# Patient Record
Sex: Male | Born: 1935 | Race: Black or African American | Hispanic: No | Smoking: Former smoker
Health system: Southern US, Community
[De-identification: ages and names within clinical notes are randomized; demographics above are authoritative.]

## PROBLEM LIST (undated history)

## (undated) DIAGNOSIS — I10 Essential (primary) hypertension: Secondary | ICD-10-CM

---

## 2021-01-06 ENCOUNTER — Other Ambulatory Visit: Payer: Self-pay

## 2021-01-06 DIAGNOSIS — R531 Weakness: Secondary | ICD-10-CM | POA: Diagnosis present

## 2021-01-06 DIAGNOSIS — R5381 Other malaise: Secondary | ICD-10-CM | POA: Diagnosis present

## 2021-01-06 DIAGNOSIS — Z20822 Contact with and (suspected) exposure to covid-19: Secondary | ICD-10-CM | POA: Diagnosis present

## 2021-01-06 DIAGNOSIS — R9431 Abnormal electrocardiogram [ECG] [EKG]: Secondary | ICD-10-CM | POA: Diagnosis not present

## 2021-01-06 DIAGNOSIS — M7989 Other specified soft tissue disorders: Secondary | ICD-10-CM | POA: Diagnosis present

## 2021-01-06 DIAGNOSIS — G4700 Insomnia, unspecified: Secondary | ICD-10-CM | POA: Diagnosis present

## 2021-01-06 DIAGNOSIS — R262 Difficulty in walking, not elsewhere classified: Secondary | ICD-10-CM | POA: Diagnosis present

## 2021-01-06 DIAGNOSIS — C7951 Secondary malignant neoplasm of bone: Secondary | ICD-10-CM | POA: Diagnosis present

## 2021-01-06 DIAGNOSIS — E8809 Other disorders of plasma-protein metabolism, not elsewhere classified: Secondary | ICD-10-CM | POA: Diagnosis present

## 2021-01-06 DIAGNOSIS — Z515 Encounter for palliative care: Secondary | ICD-10-CM

## 2021-01-06 DIAGNOSIS — F0390 Unspecified dementia without behavioral disturbance: Secondary | ICD-10-CM | POA: Diagnosis present

## 2021-01-06 DIAGNOSIS — M79642 Pain in left hand: Secondary | ICD-10-CM | POA: Diagnosis present

## 2021-01-06 DIAGNOSIS — R748 Abnormal levels of other serum enzymes: Secondary | ICD-10-CM | POA: Diagnosis present

## 2021-01-06 DIAGNOSIS — E876 Hypokalemia: Secondary | ICD-10-CM | POA: Diagnosis present

## 2021-01-06 DIAGNOSIS — I10 Essential (primary) hypertension: Secondary | ICD-10-CM | POA: Diagnosis present

## 2021-01-06 DIAGNOSIS — Z681 Body mass index (BMI) 19 or less, adult: Secondary | ICD-10-CM

## 2021-01-06 DIAGNOSIS — R001 Bradycardia, unspecified: Secondary | ICD-10-CM | POA: Diagnosis present

## 2021-01-06 DIAGNOSIS — Z781 Physical restraint status: Secondary | ICD-10-CM

## 2021-01-06 DIAGNOSIS — R627 Adult failure to thrive: Principal | ICD-10-CM | POA: Diagnosis present

## 2021-01-06 DIAGNOSIS — Z79899 Other long term (current) drug therapy: Secondary | ICD-10-CM

## 2021-01-06 DIAGNOSIS — D630 Anemia in neoplastic disease: Secondary | ICD-10-CM | POA: Diagnosis present

## 2021-01-06 DIAGNOSIS — G9341 Metabolic encephalopathy: Secondary | ICD-10-CM | POA: Diagnosis present

## 2021-01-06 DIAGNOSIS — Z87891 Personal history of nicotine dependence: Secondary | ICD-10-CM

## 2021-01-06 DIAGNOSIS — C61 Malignant neoplasm of prostate: Secondary | ICD-10-CM | POA: Diagnosis present

## 2021-01-06 DIAGNOSIS — E785 Hyperlipidemia, unspecified: Secondary | ICD-10-CM | POA: Diagnosis present

## 2021-01-06 NOTE — ED Triage Notes (Incomplete)
Patient arrived via POV c/o weakness with potential fall. Per patient he lowered himself to the floor when his legs got weak. Per family he was found lying on floor. Patient family states recent weight loss. Patient is AO x 2, VS

## 2021-01-06 NOTE — ED Triage Notes (Signed)
Patient arrived via POV c/o weakness with potential fall. Per patient he lowered himself to the floor when his legs got weak. Per family he was found lying on floor. Patient family states recent weight loss. Patient is AO x 2, VS w/ decreased HR, unable to walk at this time.

## 2021-01-07 ENCOUNTER — Other Ambulatory Visit: Payer: Self-pay

## 2021-01-07 ENCOUNTER — Encounter (HOSPITAL_BASED_OUTPATIENT_CLINIC_OR_DEPARTMENT_OTHER): Payer: Self-pay | Admitting: Emergency Medicine

## 2021-01-07 ENCOUNTER — Emergency Department (HOSPITAL_BASED_OUTPATIENT_CLINIC_OR_DEPARTMENT_OTHER): Payer: Medicare Other

## 2021-01-07 ENCOUNTER — Inpatient Hospital Stay (HOSPITAL_BASED_OUTPATIENT_CLINIC_OR_DEPARTMENT_OTHER)
Admission: EM | Admit: 2021-01-07 | Discharge: 2021-01-13 | DRG: 640 | Disposition: A | Discharge status: Skilled Nursing Facility | Payer: Medicare Other | Attending: Family Medicine | Admitting: Family Medicine

## 2021-01-07 DIAGNOSIS — R748 Abnormal levels of other serum enzymes: Secondary | ICD-10-CM | POA: Diagnosis present

## 2021-01-07 DIAGNOSIS — R001 Bradycardia, unspecified: Secondary | ICD-10-CM

## 2021-01-07 DIAGNOSIS — Z79899 Other long term (current) drug therapy: Secondary | ICD-10-CM | POA: Diagnosis not present

## 2021-01-07 DIAGNOSIS — I10 Essential (primary) hypertension: Secondary | ICD-10-CM | POA: Diagnosis present

## 2021-01-07 DIAGNOSIS — R627 Adult failure to thrive: Secondary | ICD-10-CM | POA: Diagnosis present

## 2021-01-07 DIAGNOSIS — D649 Anemia, unspecified: Secondary | ICD-10-CM

## 2021-01-07 DIAGNOSIS — E876 Hypokalemia: Secondary | ICD-10-CM | POA: Diagnosis present

## 2021-01-07 DIAGNOSIS — R9431 Abnormal electrocardiogram [ECG] [EKG]: Secondary | ICD-10-CM | POA: Diagnosis present

## 2021-01-07 DIAGNOSIS — R531 Weakness: Secondary | ICD-10-CM

## 2021-01-07 DIAGNOSIS — R262 Difficulty in walking, not elsewhere classified: Secondary | ICD-10-CM | POA: Diagnosis present

## 2021-01-07 DIAGNOSIS — M79642 Pain in left hand: Secondary | ICD-10-CM | POA: Diagnosis present

## 2021-01-07 DIAGNOSIS — R52 Pain, unspecified: Secondary | ICD-10-CM

## 2021-01-07 DIAGNOSIS — Z87891 Personal history of nicotine dependence: Secondary | ICD-10-CM | POA: Diagnosis not present

## 2021-01-07 DIAGNOSIS — D630 Anemia in neoplastic disease: Secondary | ICD-10-CM | POA: Diagnosis present

## 2021-01-07 DIAGNOSIS — E785 Hyperlipidemia, unspecified: Secondary | ICD-10-CM | POA: Diagnosis present

## 2021-01-07 DIAGNOSIS — J90 Pleural effusion, not elsewhere classified: Secondary | ICD-10-CM

## 2021-01-07 DIAGNOSIS — G9341 Metabolic encephalopathy: Secondary | ICD-10-CM | POA: Diagnosis present

## 2021-01-07 DIAGNOSIS — C61 Malignant neoplasm of prostate: Secondary | ICD-10-CM | POA: Diagnosis present

## 2021-01-07 DIAGNOSIS — N4289 Other specified disorders of prostate: Secondary | ICD-10-CM | POA: Diagnosis not present

## 2021-01-07 DIAGNOSIS — M7989 Other specified soft tissue disorders: Secondary | ICD-10-CM | POA: Diagnosis present

## 2021-01-07 DIAGNOSIS — R5381 Other malaise: Secondary | ICD-10-CM | POA: Diagnosis present

## 2021-01-07 DIAGNOSIS — Z20822 Contact with and (suspected) exposure to covid-19: Secondary | ICD-10-CM | POA: Diagnosis present

## 2021-01-07 DIAGNOSIS — C7951 Secondary malignant neoplasm of bone: Secondary | ICD-10-CM | POA: Diagnosis present

## 2021-01-07 DIAGNOSIS — Z515 Encounter for palliative care: Secondary | ICD-10-CM | POA: Diagnosis not present

## 2021-01-07 DIAGNOSIS — F0390 Unspecified dementia without behavioral disturbance: Secondary | ICD-10-CM | POA: Diagnosis present

## 2021-01-07 DIAGNOSIS — Z681 Body mass index (BMI) 19 or less, adult: Secondary | ICD-10-CM | POA: Diagnosis not present

## 2021-01-07 DIAGNOSIS — E8809 Other disorders of plasma-protein metabolism, not elsewhere classified: Secondary | ICD-10-CM | POA: Diagnosis present

## 2021-01-07 HISTORY — DX: Essential (primary) hypertension: I10

## 2021-01-07 LAB — CBC WITH DIFFERENTIAL/PLATELET
Abs Immature Granulocytes: 0.05 10*3/uL (ref 0.00–0.07)
Basophils Absolute: 0 10*3/uL (ref 0.0–0.1)
Basophils Relative: 0 %
Eosinophils Absolute: 0 10*3/uL (ref 0.0–0.5)
Eosinophils Relative: 0 %
HCT: 22.5 % — ABNORMAL LOW (ref 39.0–52.0)
Hemoglobin: 7.5 g/dL — ABNORMAL LOW (ref 13.0–17.0)
Immature Granulocytes: 1 %
Lymphocytes Relative: 24 %
Lymphs Abs: 1.3 10*3/uL (ref 0.7–4.0)
MCH: 29.3 pg (ref 26.0–34.0)
MCHC: 33.3 g/dL (ref 30.0–36.0)
MCV: 87.9 fL (ref 80.0–100.0)
Monocytes Absolute: 0.6 10*3/uL (ref 0.1–1.0)
Monocytes Relative: 10 %
Neutro Abs: 3.6 10*3/uL (ref 1.7–7.7)
Neutrophils Relative %: 65 %
Platelets: 223 10*3/uL (ref 150–400)
RBC: 2.56 MIL/uL — ABNORMAL LOW (ref 4.22–5.81)
RDW: 17.6 % — ABNORMAL HIGH (ref 11.5–15.5)
WBC: 5.6 10*3/uL (ref 4.0–10.5)
nRBC: 0 % (ref 0.0–0.2)

## 2021-01-07 LAB — COMPREHENSIVE METABOLIC PANEL
ALT: 7 U/L (ref 0–44)
AST: 24 U/L (ref 15–41)
Albumin: 3.2 g/dL — ABNORMAL LOW (ref 3.5–5.0)
Alkaline Phosphatase: 309 U/L — ABNORMAL HIGH (ref 38–126)
Anion gap: 11 (ref 5–15)
BUN: 14 mg/dL (ref 8–23)
CO2: 25 mmol/L (ref 22–32)
Calcium: 8 mg/dL — ABNORMAL LOW (ref 8.9–10.3)
Chloride: 99 mmol/L (ref 98–111)
Creatinine, Ser: 0.73 mg/dL (ref 0.61–1.24)
GFR, Estimated: >60 mL/min (ref >60)
Glucose, Bld: 100 mg/dL — ABNORMAL HIGH (ref 70–99)
Potassium: 3 mmol/L — ABNORMAL LOW (ref 3.5–5.1)
Sodium: 135 mmol/L (ref 135–145)
Total Bilirubin: 1.6 mg/dL — ABNORMAL HIGH (ref 0.3–1.2)
Total Protein: 6.5 g/dL (ref 6.5–8.1)

## 2021-01-07 LAB — URINALYSIS, ROUTINE W REFLEX MICROSCOPIC
Bilirubin Urine: NEGATIVE
Glucose, UA: NEGATIVE mg/dL
Ketones, ur: NEGATIVE mg/dL
Leukocytes,Ua: NEGATIVE
Nitrite: NEGATIVE
Protein, ur: 30 mg/dL — AB
Specific Gravity, Urine: 1.015 (ref 1.005–1.030)
pH: 7 (ref 5.0–8.0)

## 2021-01-07 LAB — RESP PANEL BY RT-PCR (FLU A&B, COVID) ARPGX2
Influenza A by PCR: NEGATIVE
Influenza B by PCR: NEGATIVE
SARS Coronavirus 2 by RT PCR: NEGATIVE

## 2021-01-07 LAB — URINALYSIS, MICROSCOPIC (REFLEX): RBC / HPF: >50 RBC/hpf (ref 0–5)

## 2021-01-07 LAB — TROPONIN I (HIGH SENSITIVITY)
Troponin I (High Sensitivity): 15 ng/L (ref <18)
Troponin I (High Sensitivity): 18 ng/L — ABNORMAL HIGH (ref <18)

## 2021-01-07 LAB — BRAIN NATRIURETIC PEPTIDE: B Natriuretic Peptide: 258.6 pg/mL — ABNORMAL HIGH (ref 0.0–100.0)

## 2021-01-07 LAB — ETHANOL: Alcohol, Ethyl (B): <10 mg/dL (ref <10)

## 2021-01-07 LAB — CK: Total CK: 307 U/L (ref 49–397)

## 2021-01-07 LAB — MAGNESIUM: Magnesium: 1.7 mg/dL (ref 1.7–2.4)

## 2021-01-07 LAB — TSH: TSH: 1.495 u[IU]/mL (ref 0.350–4.500)

## 2021-01-07 MED ORDER — ENOXAPARIN SODIUM 40 MG/0.4ML ~~LOC~~ SOLN
40.0000 mg | SUBCUTANEOUS | Status: DC
  Administered 2021-01-07 – 2021-01-12 (×6): 40 mg via SUBCUTANEOUS
  Filled 2021-01-07 (×6): qty 0.4

## 2021-01-07 MED ORDER — POTASSIUM CHLORIDE CRYS ER 20 MEQ PO TBCR
40.0000 meq | EXTENDED_RELEASE_TABLET | Freq: Once | ORAL | Status: AC
  Administered 2021-01-07: 40 meq via ORAL
  Filled 2021-01-07: qty 2

## 2021-01-07 MED ORDER — SODIUM CHLORIDE 0.9 % IV SOLN: INTRAVENOUS | Status: DC | PRN

## 2021-01-07 MED ORDER — POTASSIUM CHLORIDE 10 MEQ/100ML IV SOLN
10.0000 meq | INTRAVENOUS | Status: AC
  Administered 2021-01-07 (×3): 10 meq via INTRAVENOUS
  Filled 2021-01-07 (×3): qty 100

## 2021-01-07 MED ORDER — SODIUM CHLORIDE 0.9 % IV BOLUS
500.0000 mL | Freq: Once | INTRAVENOUS | Status: AC
  Administered 2021-01-07: 500 mL via INTRAVENOUS

## 2021-01-07 MED ORDER — IOHEXOL 300 MG/ML  SOLN
100.0000 mL | Freq: Once | INTRAMUSCULAR | Status: AC | PRN
  Administered 2021-01-07: 100 mL via INTRAVENOUS

## 2021-01-07 NOTE — H&P (Signed)
History and Physical    Michaelanthony Kempton Halls RCB:[id number] DOB: [birth year] DOA: 01/07/2021  PCP: Patient, No Pcp Per   Chief Complaint: Weakness/Fall  HPI: Jamie Potter is a 87 y.o. male with medical history significant of hypertension, hyperlipidemia presents to the emergency department for evaluation of weakness at home.  Patient lives by himself with frequent checks from family members.  The patient's son is at bedside.  He states that his sister tried to call the patient today but could not get him on the phone.  They went over to check on him and he was on the ground.  He was able to get to the door to open it but seemed very weak.  They got him changed and in bed and noticed that he was having trouble walking.  He is complaining of some right side pain and the son noticed a mass in that area which he suspects is a hernia.  The patient tells me that he is not having any complaints currently.  He denies any chest pain or shortness of breath.  The son arrives with his medications which include Lasix but he is unsure how compliant the patient is with these.  The son also noticed some swelling in both legs which seems worse than normal.  Patient does have a cane at home but uses it inconsistently.    ED Course: In the ED patient had labs drawn remarkable for hypokalemia, hypocalcemia hypoalbuminemia elevated alk phos and minimally elevated T bili.  UA appears somewhat dark with rare bacteria.  Patient also notably anemic at 7.5.  Patient received IV fluids, chest x-ray CT head and abdomen pelvis is all supplemental potassium 50 mEq in total.  Given profound weakness ambulatory dysfunction and worrying history per family patient was transferred to [facility] for further evaluation and treatment.  Review of Systems: As per HPI, otherwise unremarkable.   Assessment/Plan Active Problems:   General weakness   Weakness generalized    Ambulatory dysfunction, profound weakness and debility, POA,  multifactorial -Follow anemia, electrolyte panel, failure to thrive/poor p.o. intake as outlined below  Acute metabolic encephalopathy - Likely multifactorial - worry this is mostly   Acute symptomatic anemia baseline -Continue to follow labs, unknown baseline, will attempt to get records from family -Patient denies any acute bleeding issues or new anticoagulation, melena hematochezia or bright red blood per rectum (although doesn't check his stool often).  Incidental prostate mass -Seen on CT abdomen at [place] -? Metastatic disease per radiology - Consult oncology in the am for further insight/testing/imaging - patient has all other care in [place] and wants to follow with oncology there  Acute symptomatic electrolyte abnormalities Rule out infectious process, polypharmacy  -Labs remarkable for hypokalemia 3, hypoalbuminemia 3.2, replete and increase p.o. intake as tolerated -Cultures pending, med rec still pending follow closely for any sedating medications -Patient declines any previous alcohol use or abuse  Hypertension  -Resume home medications once med rec verified  Hyperlipidemia -Resume home medications once med rec is verified  DVT prophylaxis: Lovenox Code Status: Full Family Communication: None present Status is: Inpatient  Dispo: The patient is from: Home              Anticipated d/c is to: To be determined              Anticipated d/c date is: 01/09/2021              Patient currently not medically stable for discharge  given weakness and unsafe disposition (cannot return home without support)  Consultants:   None  Procedures:   None indicated   Past Medical History:  Diagnosis Date   Hypertension     History reviewed. No pertinent surgical history.   reports that he has quit smoking. His smoking use included cigarettes. He has never used smokeless tobacco. He reports current alcohol use of about 5.0 standard drinks of alcohol per week. He  reports previous drug use.  No Known Allergies  History reviewed. No pertinent family history.  Prior to Admission medications   Medication Sig Start Date End Date Taking? Authorizing Provider  amLODipine (NORVASC) 10 MG tablet Take 10 mg by mouth daily. 10/15/20   [provider]  furosemide (LASIX) 20 MG tablet Take 20 mg by mouth daily. 11/13/20   [provider]  lisinopril (ZESTRIL) 40 MG tablet  08/02/20   [provider]  lovastatin (MEVACOR) 40 MG tablet Take 40 mg by mouth daily. 10/12/20   [provider]    Physical Exam: Vitals:   01/07/21 1100 01/07/21 1300 01/07/21 1400 01/07/21 1641  BP: (!) 151/54 (!) 193/68 (!) 158/55 (!) 156/52  Pulse: (!) 41 (!) 44 (!) 43 (!) 45  Resp: (!) 21 18 16 20   Temp:    98.2 F (36.8 C)  TempSrc:    Oral  SpO2: 95% 94% 93% 97%  Weight:      Height:        Constitutional: NAD, calm, comfortable Vitals:   01/07/21 1100 01/07/21 1300 01/07/21 1400 01/07/21 1641  BP: (!) 151/54 (!) 193/68 (!) 158/55 (!) 156/52  Pulse: (!) 41 (!) 44 (!) 43 (!) 45  Resp: (!) 21 18 16 20   Temp:    98.2 F (36.8 C)  TempSrc:    Oral  SpO2: 95% 94% 93% 97%  Weight:      Height:       General:  Pleasantly resting in bed, No acute distress. HEENT:  Normocephalic atraumatic.  Sclerae nonicteric, noninjected.  Extraocular movements intact bilaterally. Neck:  Without mass or deformity.  Trachea is midline. Lungs:  Clear to auscultate bilaterally without rhonchi, wheeze, or rales. Heart:  Regular rate and rhythm.  Without murmurs, rubs, or gallops. Abdomen:  Soft, nontender, nondistended.  Without guarding or rebound. Extremities: Without cyanosis, clubbing, edema, or obvious deformity. Vascular:  Dorsalis pedis and posterior tibial pulses palpable bilaterally. Skin:  Warm and dry, no erythema, no ulcerations.  Labs on Admission: I have personally reviewed following labs and imaging studies  CBC: Recent Labs  Lab  01/07/21 0053  WBC 5.6  NEUTROABS 3.6  HGB 7.5*  HCT 22.5*  MCV 87.9  PLT 253   Basic Metabolic Panel: Recent Labs  Lab 01/07/21 0053  NA 135  K 3.0*  CL 99  CO2 25  GLUCOSE 100*  BUN 14  CREATININE 0.73  CALCIUM 8.0*  MG 1.7   GFR: Estimated Creatinine Clearance: 52.2 mL/min (by C-G formula based on SCr of 0.73 mg/dL). Liver Function Tests: Recent Labs  Lab 01/07/21 0053  AST 24  ALT 7  ALKPHOS 309*  BILITOT 1.6*  PROT 6.5  ALBUMIN 3.2*   No results for input(s): LIPASE, AMYLASE in the last 168 hours. No results for input(s): AMMONIA in the last 168 hours. Coagulation Profile: No results for input(s): INR, PROTIME in the last 168 hours. Cardiac Enzymes: Recent Labs  Lab 01/07/21 0053  CKTOTAL 307   BNP (last 3 results) No results  for input(s): PROBNP in the last 8760 hours. HbA1C: No results for input(s): HGBA1C in the last 72 hours. CBG: No results for input(s): GLUCAP in the last 168 hours. Lipid Profile: No results for input(s): CHOL, HDL, LDLCALC, TRIG, CHOLHDL, LDLDIRECT in the last 72 hours. Thyroid Function Tests: Recent Labs    01/07/21 0300  TSH 1.495   Anemia Panel: No results for input(s): VITAMINB12, FOLATE, FERRITIN, TIBC, IRON, RETICCTPCT in the last 72 hours. Urine analysis:    Component Value Date/Time   COLORURINE AMBER (A) 01/07/2021 1019   APPEARANCEUR HAZY (A) 01/07/2021 1019   LABSPEC 1.015 01/07/2021 1019   PHURINE 7.0 01/07/2021 1019   GLUCOSEU NEGATIVE 01/07/2021 1019   HGBUR LARGE (A) 01/07/2021 1019   BILIRUBINUR NEGATIVE 01/07/2021 1019   KETONESUR NEGATIVE 01/07/2021 1019   PROTEINUR 30 (A) 01/07/2021 1019   NITRITE NEGATIVE 01/07/2021 1019   LEUKOCYTESUR NEGATIVE 01/07/2021 1019    Radiological Exams on Admission: CT Head Wo Contrast  Result Date: 01/07/2021 CLINICAL DATA:  Mental status change EXAM: CT HEAD WITHOUT CONTRAST TECHNIQUE: Contiguous axial images were obtained from the base of the skull through  the vertex without intravenous contrast. COMPARISON:  None. FINDINGS: Brain: No evidence of acute territorial infarction, hemorrhage, hydrocephalus,extra-axial collection or mass lesion/mass effect. There is dilatation the ventricles and sulci consistent with age-related atrophy. Low-attenuation changes in the deep white matter consistent with small vessel ischemia. Vascular: No hyperdense vessel or unexpected calcification. Skull: The skull is intact. No fracture or focal lesion identified. Sinuses/Orbits: The visualized paranasal sinuses and mastoid air cells are clear. The orbits and globes intact. Other: None IMPRESSION: No acute intracranial abnormality. Findings consistent with age related atrophy and chronic small vessel ischemia Electronically Signed   By: Prudencio Pair M.D.   On: 01/07/2021 02:00   CT ABDOMEN PELVIS W CONTRAST  Result Date: 01/07/2021 CLINICAL DATA:  87 year old male with right lower quadrant abdominal pain. Weight loss. EXAM: CT ABDOMEN AND PELVIS WITH CONTRAST TECHNIQUE: Multidetector CT imaging of the abdomen and pelvis was performed using the standard protocol following bolus administration of intravenous contrast. CONTRAST:  117m OMNIPAQUE IOHEXOL 300 MG/ML  SOLN COMPARISON:  None. FINDINGS: Evaluation is limited due to streak artifact caused by patient's arms. Lower chest: Partially visualized moderate bilateral pleural effusions with partial compressive atelectasis of the lower lobes. Pneumonia is not excluded clinical correlation is recommended. There is advanced multi vessel coronary vascular calcification and partially visualized pericardial effusion measuring 1 cm in thickness. No intra-abdominal free air. Mild diffuse mesenteric edema. No free fluid. Hepatobiliary: Multiple small hepatic hypodense lesions not characterized on this CT but the larger lesion measuring 16 mm demonstrates fluid attenuation consistent with a cyst. Scattered subcentimeter lesions are too small to  characterize. There is morphologic changes of cirrhosis with mild biliary ductal dilatation versus mild periportal edema. The gallbladder is unremarkable. Pancreas: Unremarkable. No pancreatic ductal dilatation or surrounding inflammatory changes. Spleen: Normal in size without focal abnormality. Adrenals/Urinary Tract: The adrenal glands unremarkable. There is no hydronephrosis on either side. There is symmetric enhancement and excretion of contrast by both kidneys. The visualized ureters appear unremarkable. There is diffuse trabeculated appearance of the bladder wall consistent with chronic bladder outlet obstruction. Correlation with urinalysis recommended to exclude cystitis. Stomach/Bowel: There is moderate stool throughout the colon. There is diffuse colonic diverticulosis without active inflammatory changes. There is no bowel obstruction. The appendix is normal. Vascular/Lymphatic: Advanced aortoiliac atherosclerotic disease. The IVC is unremarkable. No portal venous gas. There is  a 3.5 x 3.3 cm rounded mass with apparent central hypoenhancement to the left of the distal abdominal aorta (45/2) most likely a centrally necrotic enlarged lymph node. There is a 4.3 x 4.4 cm rounded solid mass along the right pelvic side wall likely an enlarged lymph node versus a malignancy such as sarcoma. Reproductive: Enlarged and heterogeneous prostate gland with areas of nodularity most consistent with prostate cancer. Correlation with clinical exam and PSA levels recommended. Other: Diffuse subcutaneous edema and anasarca. No fluid collection. Musculoskeletal: Diffuse sclerotic metastatic disease. No acute osseous pathology. IMPRESSION: 1. Findings most consistent with prostate cancer with extensive osseous metastasis. Correlation with clinical exam and PSA levels recommended. 2. Rounded soft tissue masses to the left of the distal abdominal aorta and along the right iliac chain most consistent with metastatic adenopathy.  3. Diffuse colonic diverticulosis. No bowel obstruction. Normal appendix. 4. Cirrhosis. 5. Partially visualized moderate bilateral pleural effusions with partial compressive atelectasis of the lower lobes. 6. Aortic Atherosclerosis (ICD10-I70.0). Electronically Signed   By: Anner Crete M.D.   On: 01/07/2021 02:10   DG Chest Portable 1 View  Result Date: 01/07/2021 CLINICAL DATA:  Weakness EXAM: PORTABLE CHEST 1 VIEW COMPARISON:  None. FINDINGS: The heart size and mediastinal contours are mildly enlarged with aortic knob calcifications are seen. There is prominence of the central pulmonary vasculature. Small bilateral pleural effusions are seen, left greater than right. The visualized skeletal structures are unremarkable. IMPRESSION: Mild pulmonary vascular congestion and small bilateral pleural effusions. Electronically Signed   By: Prudencio Pair M.D.   On: 01/07/2021 01:01    EKG: Independently reviewed.  Sinus bradycardia without overt ST elevation or depression   Little Ishikawa DO [facility] For contact please use secure messenger on Epic  If 7PM-7AM, please contact night-coverage located on [email or web address]   01/07/2021, 5:08 PM

## 2021-01-07 NOTE — ED Notes (Signed)
Report given to [facility] 

## 2021-01-07 NOTE — ED Notes (Signed)
Pt is aware of [place] needing a urine sample. And Pts son will press call bell when they have a sample.

## 2021-01-07 NOTE — ED Provider Notes (Signed)
Emergency Department Provider Note   I have reviewed the triage vital signs and the nursing notes.   HISTORY  Chief Complaint Fall and Weakness   HPI Jamie Potter is a 87 y.o. male with past medical history of hypertension, hyperlipidemia presents to the emergency department for evaluation of weakness at home.  Patient lives by himself with frequent checks from family members.  The patient's son is at bedside.  He states that his sister tried to call the patient today but could not get him on the phone.  They went over to check on him and he was on the ground.  He was able to get to the door to open it but seemed very weak.  They got him changed and in bed and noticed that he was having trouble walking.  He is complaining of some right side pain and the son noticed a mass in that area which he suspects is a hernia.  The patient tells me that he is not having any complaints currently.  He denies any chest pain or shortness of breath.  The son arrives with his medications which include Lasix but he is unsure how compliant the patient is with these.  The son also noticed some swelling in both legs which seems worse than normal.  Patient does have a cane at home but uses it inconsistently.    Past Medical History:  Diagnosis Date  . Hypertension     Patient Active Problem List   Diagnosis Date Noted  . General weakness 01/07/2021    History reviewed. No pertinent surgical history.  Allergies Patient has no known allergies.  History reviewed. No pertinent family history.  Social History Social History   Tobacco Use  . Smoking status: Former Smoker    Types: Cigarettes  . Smokeless tobacco: Never Used  Vaping Use  . Vaping Use: Never used  Substance Use Topics  . Alcohol use: Yes    Alcohol/week: 5.0 standard drinks    Types: 5 Cans of beer per week  . Drug use: Not Currently    Review of Systems  Constitutional: No fever/chills. Positive generalized weakness.   Eyes: No visual changes. ENT: No sore throat. Cardiovascular: Denies chest pain. Respiratory: Denies shortness of breath. Gastrointestinal: Right sided abdominal pain.  No nausea, no vomiting.  No diarrhea.  No constipation. Genitourinary: Negative for dysuria. Musculoskeletal: Negative for back pain. Skin: Negative for rash. Neurological: Negative for headaches, focal weakness or numbness.  10-point ROS otherwise negative.  ____________________________________________   PHYSICAL EXAM:  VITAL SIGNS: ED Triage Vitals  Enc Vitals Group     BP 01/07/21 0001 (!) 141/49     Pulse Rate 01/07/21 0001 (!) 46     Resp 01/07/21 0001 19     Temp 01/07/21 0001 99.7 F (37.6 C)     Temp Source 01/07/21 0001 Oral     SpO2 01/07/21 0001 95 %     Weight 01/07/21 0005 130 lb (59 kg)     Height 01/07/21 0005 5\' 9"  (1.753 m)   Constitutional: Alert and oriented. Well appearing and in no acute distress. Eyes: Conjunctivae are normal. PERRL. Head: Atraumatic. Nose: No congestion/rhinnorhea. Mouth/Throat: Mucous membranes are moist. Neck: No stridor.  Cardiovascular: Normal rate, regular rhythm. Good peripheral circulation. Grossly normal heart sounds.   Respiratory: Normal respiratory effort.  No retractions. Lungs CTAB. Gastrointestinal: Soft and nontender. No distention.  Musculoskeletal: No lower extremity tenderness with bilateral 2+ pitting edema. No gross deformities of extremities.  Neurologic:  Normal speech and language. No gross focal neurologic deficits are appreciated.  Skin:  Skin is warm, dry and intact. No rash noted.   ____________________________________________   LABS (all labs ordered are listed, but only abnormal results are displayed)  Labs Reviewed  COMPREHENSIVE METABOLIC PANEL - Abnormal; Notable for the following components:      Result Value   Potassium 3.0 (*)    Glucose, Bld 100 (*)    Calcium 8.0 (*)    Albumin 3.2 (*)    Alkaline Phosphatase 309 (*)     Total Bilirubin 1.6 (*)    All other components within normal limits  CBC WITH DIFFERENTIAL/PLATELET - Abnormal; Notable for the following components:   RBC 2.56 (*)    Hemoglobin 7.5 (*)    HCT 22.5 (*)    RDW 17.6 (*)    All other components within normal limits  BRAIN NATRIURETIC PEPTIDE - Abnormal; Notable for the following components:   B Natriuretic Peptide 258.6 (*)    All other components within normal limits  RESP PANEL BY RT-PCR (FLU A&B, COVID) ARPGX2  URINE CULTURE  CK  ETHANOL  URINALYSIS, ROUTINE W REFLEX MICROSCOPIC  MAGNESIUM  TSH  TROPONIN I (HIGH SENSITIVITY)  TROPONIN I (HIGH SENSITIVITY)   ____________________________________________  EKG   EKG Interpretation  Date/Time:  Thursday January 07 2021 00:35:33 EDT Ventricular Rate:  44 PR Interval:    QRS Duration: 99 QT Interval:  596 QTC Calculation: 510 R Axis:   59 Text Interpretation: Sinus bradycardia Prolonged PR interval Low voltage, extremity leads Prolonged QT interval no old tracing for comparison Confirmed by Nanda Quinton [phone]) on 01/07/2021 12:41:36 AM       ____________________________________________  RADIOLOGY  CT Head Wo Contrast  Result Date: 01/07/2021 CLINICAL DATA:  Mental status change EXAM: CT HEAD WITHOUT CONTRAST TECHNIQUE: Contiguous axial images were obtained from the base of the skull through the vertex without intravenous contrast. COMPARISON:  None. FINDINGS: Brain: No evidence of acute territorial infarction, hemorrhage, hydrocephalus,extra-axial collection or mass lesion/mass effect. There is dilatation the ventricles and sulci consistent with age-related atrophy. Low-attenuation changes in the deep white matter consistent with small vessel ischemia. Vascular: No hyperdense vessel or unexpected calcification. Skull: The skull is intact. No fracture or focal lesion identified. Sinuses/Orbits: The visualized paranasal sinuses and mastoid air cells are clear. The orbits and  globes intact. Other: None IMPRESSION: No acute intracranial abnormality. Findings consistent with age related atrophy and chronic small vessel ischemia Electronically Signed   By: Prudencio Pair M.D.   On: 01/07/2021 02:00   CT ABDOMEN PELVIS W CONTRAST  Result Date: 01/07/2021 CLINICAL DATA:  87 year old male with right lower quadrant abdominal pain. Weight loss. EXAM: CT ABDOMEN AND PELVIS WITH CONTRAST TECHNIQUE: Multidetector CT imaging of the abdomen and pelvis was performed using the standard protocol following bolus administration of intravenous contrast. CONTRAST:  136mL OMNIPAQUE IOHEXOL 300 MG/ML  SOLN COMPARISON:  None. FINDINGS: Evaluation is limited due to streak artifact caused by patient's arms. Lower chest: Partially visualized moderate bilateral pleural effusions with partial compressive atelectasis of the lower lobes. Pneumonia is not excluded clinical correlation is recommended. There is advanced multi vessel coronary vascular calcification and partially visualized pericardial effusion measuring 1 cm in thickness. No intra-abdominal free air. Mild diffuse mesenteric edema. No free fluid. Hepatobiliary: Multiple small hepatic hypodense lesions not characterized on this CT but the larger lesion measuring 16 mm demonstrates fluid attenuation consistent with a cyst. Scattered subcentimeter lesions are  too small to characterize. There is morphologic changes of cirrhosis with mild biliary ductal dilatation versus mild periportal edema. The gallbladder is unremarkable. Pancreas: Unremarkable. No pancreatic ductal dilatation or surrounding inflammatory changes. Spleen: Normal in size without focal abnormality. Adrenals/Urinary Tract: The adrenal glands unremarkable. There is no hydronephrosis on either side. There is symmetric enhancement and excretion of contrast by both kidneys. The visualized ureters appear unremarkable. There is diffuse trabeculated appearance of the bladder wall consistent with  chronic bladder outlet obstruction. Correlation with urinalysis recommended to exclude cystitis. Stomach/Bowel: There is moderate stool throughout the colon. There is diffuse colonic diverticulosis without active inflammatory changes. There is no bowel obstruction. The appendix is normal. Vascular/Lymphatic: Advanced aortoiliac atherosclerotic disease. The IVC is unremarkable. No portal venous gas. There is a 3.5 x 3.3 cm rounded mass with apparent central hypoenhancement to the left of the distal abdominal aorta (45/2) most likely a centrally necrotic enlarged lymph node. There is a 4.3 x 4.4 cm rounded solid mass along the right pelvic side wall likely an enlarged lymph node versus a malignancy such as sarcoma. Reproductive: Enlarged and heterogeneous prostate gland with areas of nodularity most consistent with prostate cancer. Correlation with clinical exam and PSA levels recommended. Other: Diffuse subcutaneous edema and anasarca. No fluid collection. Musculoskeletal: Diffuse sclerotic metastatic disease. No acute osseous pathology. IMPRESSION: 1. Findings most consistent with prostate cancer with extensive osseous metastasis. Correlation with clinical exam and PSA levels recommended. 2. Rounded soft tissue masses to the left of the distal abdominal aorta and along the right iliac chain most consistent with metastatic adenopathy. 3. Diffuse colonic diverticulosis. No bowel obstruction. Normal appendix. 4. Cirrhosis. 5. Partially visualized moderate bilateral pleural effusions with partial compressive atelectasis of the lower lobes. 6. Aortic Atherosclerosis (ICD10-I70.0). Electronically Signed   By: Anner Crete M.D.   On: 01/07/2021 02:10   DG Chest Portable 1 View  Result Date: 01/07/2021 CLINICAL DATA:  Weakness EXAM: PORTABLE CHEST 1 VIEW COMPARISON:  None. FINDINGS: The heart size and mediastinal contours are mildly enlarged with aortic knob calcifications are seen. There is prominence of the  central pulmonary vasculature. Small bilateral pleural effusions are seen, left greater than right. The visualized skeletal structures are unremarkable. IMPRESSION: Mild pulmonary vascular congestion and small bilateral pleural effusions. Electronically Signed   By: Prudencio Pair M.D.   On: 01/07/2021 01:01    ____________________________________________   PROCEDURES  Procedure(s) performed:   Procedures  CRITICAL CARE Performed by: Margette Fast Total critical care time: 35 minutes Critical care time was exclusive of separately billable procedures and treating other patients. Critical care was necessary to treat or prevent imminent or life-threatening deterioration. Critical care was time spent personally by me on the following activities: development of treatment plan with patient and/or surrogate as well as nursing, discussions with consultants, evaluation of patient's response to treatment, examination of patient, obtaining history from patient or surrogate, ordering and performing treatments and interventions, ordering and review of laboratory studies, ordering and review of radiographic studies, pulse oximetry and re-evaluation of patient's condition.  Nanda Quinton, MD Emergency Medicine  ____________________________________________   INITIAL IMPRESSION / ASSESSMENT AND PLAN / ED COURSE  Pertinent labs & imaging results that were available during my care of the patient were reviewed by me and considered in my medical decision making (see chart for details).   Patient presents to the emergency department valuation generalized weakness.  Family came to check on him today after they had trouble getting him on the  phone and found him on the ground.  He has no outward sign of injury, head trauma, bruising.  He is moving the extremities equally with no symptoms or signs of stroke.  Patient is minimizing most of his symptoms but family reports some concern regarding his weakness.  Seems  likely more chronic but patient does have swelling in the legs as well as likely hernia in the right inguinal area.  Plan for CT imaging of the head, abdomen/pelvis and CXR.  With no exact downtime noted plan for CK along with chemistry, troponins, UA, COVID/flu swabs.   03:00 AM  CT imaging and labs reviewed and discussed with the patient and son at bedside. Potassium replaced PO here. Discussed EKG with Dr. Terrence Dupont with Cardiology. He agrees with my interpretation. Does not see an indication for emergent/urgent pacemaker. Will replace K and follow Mg and TSH. Plan for medicine admit. COVID negative. CK normal. Hb also low with weakness likely multifactorial. Question anemia of chronic disease with likely metestatic prostate cancer. No blood bank here and so will need Type and Screen/anemia panel sent at accepting facility. No hypotension or indication for emergent PRBC transfusion.   Discussed patient's case with [facility], Dr. Myna Hidalgo to request admission. Patient and family (if present) updated with plan. Care transferred to [facility] service.  I reviewed all nursing notes, vitals, pertinent old records, EKGs, labs, imaging (as available).  ____________________________________________  FINAL CLINICAL IMPRESSION(S) / ED DIAGNOSES  Final diagnoses:  Symptomatic bradycardia  Prolonged Q-T interval on ECG  Anemia, unspecified type  Pleural effusion, bilateral     MEDICATIONS GIVEN DURING THIS VISIT:  Medications  sodium chloride 0.9 % bolus 500 mL (0 mLs Intravenous Stopped 01/07/21 0230)  iohexol (OMNIPAQUE) 300 MG/ML solution 100 mL (100 mLs Intravenous Contrast Given 01/07/21 0142)  potassium chloride SA (KLOR-CON) CR tablet 40 mEq (40 mEq Oral Given 01/07/21 0242)     Note:  This document was prepared using Dragon voice recognition software and may include unintentional dictation errors.  Nanda Quinton, MD, [facility] Emergency Medicine    Daquisha Clermont, [facility], MD 01/07/21 [phone]

## 2021-01-08 DIAGNOSIS — E876 Hypokalemia: Secondary | ICD-10-CM | POA: Diagnosis not present

## 2021-01-08 DIAGNOSIS — R531 Weakness: Secondary | ICD-10-CM | POA: Diagnosis not present

## 2021-01-08 DIAGNOSIS — N4289 Other specified disorders of prostate: Secondary | ICD-10-CM | POA: Diagnosis not present

## 2021-01-08 DIAGNOSIS — R627 Adult failure to thrive: Secondary | ICD-10-CM | POA: Diagnosis present

## 2021-01-08 LAB — COMPREHENSIVE METABOLIC PANEL
ALT: 8 U/L (ref 0–44)
AST: 18 U/L (ref 15–41)
Albumin: 2.9 g/dL — ABNORMAL LOW (ref 3.5–5.0)
Alkaline Phosphatase: 264 U/L — ABNORMAL HIGH (ref 38–126)
Anion gap: 9 (ref 5–15)
BUN: 12 mg/dL (ref 8–23)
CO2: 22 mmol/L (ref 22–32)
Calcium: 8 mg/dL — ABNORMAL LOW (ref 8.9–10.3)
Chloride: 104 mmol/L (ref 98–111)
Creatinine, Ser: 0.62 mg/dL (ref 0.61–1.24)
GFR, Estimated: >60 mL/min (ref >60)
Glucose, Bld: 97 mg/dL (ref 70–99)
Potassium: 3.3 mmol/L — ABNORMAL LOW (ref 3.5–5.1)
Sodium: 135 mmol/L (ref 135–145)
Total Bilirubin: 1.9 mg/dL — ABNORMAL HIGH (ref 0.3–1.2)
Total Protein: 6.2 g/dL — ABNORMAL LOW (ref 6.5–8.1)

## 2021-01-08 LAB — CBC
HCT: 24.5 % — ABNORMAL LOW (ref 39.0–52.0)
Hemoglobin: 7.7 g/dL — ABNORMAL LOW (ref 13.0–17.0)
MCH: 28.8 pg (ref 26.0–34.0)
MCHC: 31.4 g/dL (ref 30.0–36.0)
MCV: 91.8 fL (ref 80.0–100.0)
Platelets: 192 10*3/uL (ref 150–400)
RBC: 2.67 MIL/uL — ABNORMAL LOW (ref 4.22–5.81)
RDW: 17.6 % — ABNORMAL HIGH (ref 11.5–15.5)
WBC: 4.6 10*3/uL (ref 4.0–10.5)
nRBC: 0 % (ref 0.0–0.2)

## 2021-01-08 LAB — URINE CULTURE: Culture: <10000 — AB

## 2021-01-08 MED ORDER — DOCUSATE SODIUM 100 MG PO CAPS
100.0000 mg | ORAL_CAPSULE | Freq: Two times a day (BID) | ORAL | Status: DC
  Administered 2021-01-08 – 2021-01-13 (×7): 100 mg via ORAL
  Filled 2021-01-08 (×9): qty 1

## 2021-01-08 MED ORDER — LISINOPRIL 20 MG PO TABS
40.0000 mg | ORAL_TABLET | Freq: Every day | ORAL | Status: DC
Start: 2021-01-08 — End: 2021-01-13
  Administered 2021-01-08 – 2021-01-13 (×6): 40 mg via ORAL
  Filled 2021-01-08 (×6): qty 2

## 2021-01-08 MED ORDER — POLYETHYLENE GLYCOL 3350 17 G PO PACK
17.0000 g | PACK | Freq: Every day | ORAL | Status: DC
  Administered 2021-01-09 – 2021-01-12 (×3): 17 g via ORAL
  Filled 2021-01-08 (×4): qty 1

## 2021-01-08 MED ORDER — MIRTAZAPINE 15 MG PO TBDP
15.0000 mg | ORAL_TABLET | Freq: Every day | ORAL | Status: DC
  Filled 2021-01-08: qty 1

## 2021-01-08 MED ORDER — TRAZODONE HCL 50 MG PO TABS
50.0000 mg | ORAL_TABLET | Freq: Every day | ORAL | Status: DC
  Administered 2021-01-08: 50 mg via ORAL
  Filled 2021-01-08: qty 1

## 2021-01-08 MED ORDER — PRAVASTATIN SODIUM 20 MG PO TABS
10.0000 mg | ORAL_TABLET | Freq: Every day | ORAL | Status: DC
  Administered 2021-01-08 – 2021-01-12 (×5): 10 mg via ORAL
  Filled 2021-01-08 (×5): qty 1

## 2021-01-08 MED ORDER — SODIUM CHLORIDE 0.9 % IV SOLN: INTRAVENOUS | Status: DC | PRN

## 2021-01-08 MED ORDER — ASPIRIN EC 81 MG PO TBEC
81.0000 mg | DELAYED_RELEASE_TABLET | Freq: Every day | ORAL | Status: DC
  Administered 2021-01-08 – 2021-01-13 (×6): 81 mg via ORAL
  Filled 2021-01-08 (×6): qty 1

## 2021-01-08 MED ORDER — POTASSIUM CHLORIDE CRYS ER 20 MEQ PO TBCR
40.0000 meq | EXTENDED_RELEASE_TABLET | Freq: Once | ORAL | Status: AC
  Administered 2021-01-08: 40 meq via ORAL
  Filled 2021-01-08: qty 2

## 2021-01-08 MED ORDER — MELATONIN 3 MG PO TABS
3.0000 mg | ORAL_TABLET | Freq: Every day | ORAL | Status: DC
  Administered 2021-01-08: 3 mg via ORAL
  Filled 2021-01-08: qty 1

## 2021-01-08 MED ORDER — AMLODIPINE BESYLATE 10 MG PO TABS
10.0000 mg | ORAL_TABLET | Freq: Every day | ORAL | Status: DC
  Administered 2021-01-08 – 2021-01-13 (×6): 10 mg via ORAL
  Filled 2021-01-08 (×6): qty 1

## 2021-01-08 MED ORDER — ACETAMINOPHEN 325 MG PO TABS
650.0000 mg | ORAL_TABLET | Freq: Four times a day (QID) | ORAL | Status: DC | PRN
  Administered 2021-01-08: 650 mg via ORAL
  Filled 2021-01-08: qty 2

## 2021-01-08 NOTE — Plan of Care (Signed)
  Problem: Clinical Measurements: Goal: Will remain free from infection Outcome: Progressing   Problem: Activity: Goal: Risk for activity intolerance will decrease Outcome: Progressing   Problem: Safety: Goal: Ability to remain free from injury will improve Outcome: Progressing   

## 2021-01-08 NOTE — TOC Progression Note (Signed)
Transition of Care [facility]) - Progression Note    Patient Details  Name: Jamie Potter MRN: [id number] Date of Birth: 11-21-1929  [facility] CM/SW Contact  Purcell Mouton, RN Phone Number: 01/08/2021, 1:41 PM  Clinical Narrative:    Spoke with pt daughter at bedside who states her sister Jamie Potter is the person to talk with concerning discharge plans. Spoke with Jamie Potter [phone] who states that she will speak with her other sisters and brothers concerning SNF or home. Jamie Potter agreed with pt being faxed to SNF.    Expected Discharge Plan: [place] Barriers to Discharge: No Barriers Identified  Expected Discharge Plan and Services Expected Discharge Plan: [place] arrangements for the past 2 months: Single Family Home                                       Social Determinants of Health (SDOH) Interventions    Readmission Risk Interventions No flowsheet data found.

## 2021-01-08 NOTE — NC FL2 (Signed)
  [place] LEVEL OF CARE SCREENING TOOL     IDENTIFICATION  Patient Name: Jamie Potter Birthdate: 26-Jul-1930 Sex: male Admission Date (Current Location): 01/07/2021  [facility] and [place] Number:  Herbalist and Address:  [facility],  [place] [address], [place]      Provider Number: [id number]  Attending Physician Name and Address:  Flora Lipps, MD  Relative Name and Phone Number:  Leonette Most daughter [phone]    Current Level of Care: Hospital Recommended Level of Care: [place] Prior Approval Number:    Date Approved/Denied:   PASRR Number: [id number]  Discharge Plan: SNF    Current Diagnoses: Patient Active Problem List   Diagnosis Date Noted  . Failure to thrive in adult 01/08/2021  . Prostate mass 01/08/2021  . Hypokalemia 01/08/2021  . General weakness 01/07/2021  . Weakness generalized 01/07/2021    Orientation RESPIRATION BLADDER Height & Weight     Self  Normal Continent Weight: 59 kg Height:  5\' 9"  (175.3 cm)  BEHAVIORAL SYMPTOMS/MOOD NEUROLOGICAL BOWEL NUTRITION STATUS      Continent Diet (Regular)  AMBULATORY STATUS COMMUNICATION OF NEEDS Skin   Extensive Assist Verbally Normal                       Personal Care Assistance Level of Assistance  Bathing,Feeding,Dressing Bathing Assistance: Limited assistance Feeding assistance: Independent Dressing Assistance: Limited assistance     Functional Limitations Info  Sight,Hearing,Speech Sight Info: Impaired Hearing Info: Adequate Speech Info: Adequate    SPECIAL CARE FACTORS FREQUENCY  PT (By licensed PT),OT (By licensed OT)     PT Frequency: x5 week OT Frequency: x5 week            Contractures Contractures Info: Not present    Additional Factors Info  Code Status,Allergies Code Status Info: FULL Allergies Info: No Known Allergies           Current Medications (01/08/2021):  This is the current  hospital active medication list Current Facility-Administered Medications  Medication Dose Route Frequency Provider Last Rate Last Admin  . 0.9 %  sodium chloride infusion   Intravenous PRN Pokhrel, Laxman, MD      . amLODipine (NORVASC) tablet 10 mg  10 mg Oral Daily Pokhrel, Laxman, MD      . aspirin EC tablet 81 mg  81 mg Oral Daily Pokhrel, Laxman, MD      . enoxaparin (LOVENOX) injection 40 mg  40 mg Subcutaneous Q24H Little Ishikawa, MD   40 mg at 01/07/21 1821  . lisinopril (ZESTRIL) tablet 40 mg  40 mg Oral Daily Pokhrel, Laxman, MD      . mirtazapine (REMERON SOL-TAB) disintegrating tablet 15 mg  15 mg Oral QHS Pokhrel, Laxman, MD      . potassium chloride SA (KLOR-CON) CR tablet 40 mEq  40 mEq Oral Once Pokhrel, Laxman, MD      . pravastatin (PRAVACHOL) tablet 10 mg  10 mg Oral q1800 Pokhrel, Laxman, MD         Discharge Medications: Please see discharge summary for a list of discharge medications.  Relevant Imaging Results:  Relevant Lab Results:   Additional Information [phone]  Purcell Mouton, RN

## 2021-01-08 NOTE — Progress Notes (Signed)
PROGRESS NOTE  Asa Baudoin Zillmer KGU:[id number] DOB: 1930/05/19 DOA: 01/07/2021 PCP: Iva Lento, PA-C   LOS: 1 day   Brief narrative:  EGIDIO LOFGREN is a 87 y.o. male with medical history significant of hypertension, hyperlipidemia presented to hospital with generalized weakness, difficulty ambulating, poor oral intake, failure to thrive with significant weight loss. In the ED labs showed hypokalemia, hypocalcemia hypoalbuminemia elevated alk phos and minimally elevated T bili.  UA appears somewhat dark with rare bacteria.  Patient also notably anemic at 7.5.  Patient received IV fluids, chest x-ray CT head and abdomen pelvis  supplemental potassium 50 mEq in total.  Given profound weakness, ambulatory dysfunction poor oral intake, failure to thrive and concerning history per family patient was transferred to [facility] long hospital for further evaluation and treatment.  Assessment/Plan:  Principal Problem:   Weakness generalized Active Problems:   General weakness   Failure to thrive in adult   Prostate mass   Hypokalemia   Ambulatory dysfunction, profound weakness and debility, recent fall. Present on admission.  Patient is a mildly anemic.  Patient does have prostate mass with multiple osteoclastic lesions likely metastatic disease causing generalized weakness and failure to thrive.  Physical therapy optimal therapy recommended skilled nursing facility placement.  Will consult transition of care.  TSH was within normal limits.  Failure to to thrive.  Blood work with hypoalbuminemia.  Will need nutrition evaluation.  Patient stated that he did not have any appetite.  We will put the patient on Remeron since he has trouble sleeping as well.  Anemia.  Unknown baseline.  Likely anemia of chronic disease/malignancy.  No mention of blood per rectum.  CT scan of the abdomen pelvis showed prostate mass with multiple Osseous metastasis.   prostate mass with multiple osteoblastic lesions in  the bony skeleton.  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  Spoke with the patient's family at bedside.  They wish to follow-up with [facility].  I offered the option of consulting oncology here in the hospital but they wish to pursue at [facility].  I have encouraged him to take an appointment as soon as possible.  Hypokalemia, will replenish.  Check levels in a.m.  Check magnesium levels in a.m. as well.  Hypertension  -Patient is on amlodipine and lisinopril at home.  Will resume while in the hospital.  Will monitor blood pressure  Hyperlipidemia Resume statins.  DVT prophylaxis: enoxaparin (LOVENOX) injection 40 mg Start: 01/07/21 1800   Code Status: Full code  Family Communication: Spoke with the patient's daughter at bedside.  I had a prolonged discussion about the potential malignancy as diagnosis.  She indicated that she would discuss this further with her family and wished oncology care to be pursued at [facility]   Status is: Inpatient  Remains inpatient appropriate because:Unsafe d/c plan, IV treatments appropriate due to intensity of illness or inability to take PO, Inpatient level of care appropriate due to severity of illness and Need for rehabilitation, new possible malignancy   Dispo: The patient is from: Home              Anticipated d/c is to: SNF              Patient currently is not medically stable to d/c.   Difficult to place patient No   Consultants:  None  Procedures:  None  Anti-infectives:  . None  Anti-infectives (From admission, onward)   None     Subjective:  Today, patient was seen and examined at bedside.  Patient complains of generalized weakness lack of appetite.  Denies overt pain nausea vomiting fever or shortness of breath.  Objective: Vitals:   01/08/21 0010 01/08/21 0701  BP: (!) 168/57 (!) 159/56  Pulse: 64 (!) 43  Resp: 20 18  Temp: 98.2 F (36.8 C) 97.6 F (36.4 C)  SpO2: 93%  98%    Intake/Output Summary (Last 24 hours) at 01/08/2021 1241 Last data filed at 01/08/2021 1221 Gross per 24 hour  Intake 1240 ml  Output -  Net 1240 ml   Filed Weights   01/07/21 0005  Weight: 59 kg   Body mass index is 19.2 kg/m.   Physical Exam: GENERAL: Patient is alert awake and communicative. Not in obvious distress.  Thinly built, elderly male HENT: No scleral pallor or icterus. Pupils equally reactive to light. Oral mucosa is moist NECK: is supple, no gross swelling noted. CHEST: Clear to auscultation. No crackles or wheezes.  Diminished breath sounds bilaterally. CVS: S1 and S2 heard, no murmur. Regular rate and rhythm.  ABDOMEN: Soft, non-tender, bowel sounds are present. EXTREMITIES: No edema. CNS: Cranial nerves are intact. No focal motor deficits.  Moves all extremities but generalized weakness noted. SKIN: warm and dry without rashes.  Data Review: I have personally reviewed the following laboratory data and studies,  CBC: Recent Labs  Lab 01/07/21 0053 01/08/21 0402  WBC 5.6 4.6  NEUTROABS 3.6  --   HGB 7.5* 7.7*  HCT 22.5* 24.5*  MCV 87.9 91.8  PLT 223 222   Basic Metabolic Panel: Recent Labs  Lab 01/07/21 0053 01/08/21 0402  NA 135 135  K 3.0* 3.3*  CL 99 104  CO2 25 22  GLUCOSE 100* 97  BUN 14 12  CREATININE 0.73 0.62  CALCIUM 8.0* 8.0*  MG 1.7  --    Liver Function Tests: Recent Labs  Lab 01/07/21 0053 01/08/21 0402  AST 24 18  ALT 7 8  ALKPHOS 309* 264*  BILITOT 1.6* 1.9*  PROT 6.5 6.2*  ALBUMIN 3.2* 2.9*   No results for input(s): LIPASE, AMYLASE in the last 168 hours. No results for input(s): AMMONIA in the last 168 hours. Cardiac Enzymes: Recent Labs  Lab 01/07/21 0053  CKTOTAL 307   BNP (last 3 results) Recent Labs    01/07/21 0053  BNP 258.6*    ProBNP (last 3 results) No results for input(s): PROBNP in the last 8760 hours.  CBG: No results for input(s): GLUCAP in the last 168 hours. Recent Results  (from the past 240 hour(s))  Resp Panel by RT-PCR (Flu A&B, Covid) Nasopharyngeal Swab     Status: None   Collection Time: 01/07/21 12:53 AM   Specimen: Nasopharyngeal Swab; Nasopharyngeal(NP) swabs in vial transport medium  Result Value Ref Range Status   SARS Coronavirus 2 by RT PCR NEGATIVE NEGATIVE Final    Comment: (NOTE) SARS-CoV-2 target nucleic acids are NOT DETECTED.  The SARS-CoV-2 RNA is generally detectable in upper respiratory specimens during the acute phase of infection. The lowest concentration of SARS-CoV-2 viral copies this assay can detect is 138 copies/mL. A negative result does not preclude SARS-Cov-2 infection and should not be used as the sole basis for treatment or other patient management decisions. A negative result may occur with  improper specimen collection/handling, submission of specimen other than nasopharyngeal swab, presence of viral mutation(s) within the areas targeted by this assay, and inadequate number of viral copies(<138 copies/mL). A negative result must  be combined with clinical observations, patient history, and epidemiological information. The expected result is Negative.  Fact Sheet for Patients:  EntrepreneurPulse.com.au  Fact Sheet for Healthcare Providers:  IncredibleEmployment.be  This test is no t yet approved or cleared by the [place] FDA and  has been authorized for detection and/or diagnosis of SARS-CoV-2 by FDA under an Emergency Use Authorization (EUA). This EUA will remain  in effect (meaning this test can be used) for the duration of the COVID-19 declaration under Section 564(b)(1) of the Act, 21 U.S.C.section 360bbb-3(b)(1), unless the authorization is terminated  or revoked sooner.       Influenza A by PCR NEGATIVE NEGATIVE Final   Influenza B by PCR NEGATIVE NEGATIVE Final    Comment: (NOTE) The Xpert Xpress SARS-CoV-2/FLU/RSV plus assay is intended as an aid in the  diagnosis of influenza from Nasopharyngeal swab specimens and should not be used as a sole basis for treatment. Nasal washings and aspirates are unacceptable for Xpert Xpress SARS-CoV-2/FLU/RSV testing.  Fact Sheet for Patients: EntrepreneurPulse.com.au  Fact Sheet for Healthcare Providers: IncredibleEmployment.be  This test is not yet approved or cleared by the [place] FDA and has been authorized for detection and/or diagnosis of SARS-CoV-2 by FDA under an Emergency Use Authorization (EUA). This EUA will remain in effect (meaning this test can be used) for the duration of the COVID-19 declaration under Section 564(b)(1) of the Act, 21 U.S.C. section 360bbb-3(b)(1), unless the authorization is terminated or revoked.  Performed at [facility], [address] [place], [place] [address]   Urine culture     Status: Abnormal   Collection Time: 01/07/21 10:19 AM   Specimen: Urine, Random  Result Value Ref Range Status   Specimen Description   Final    URINE, RANDOM Performed at [facility], [address] [place], [place] [address]    Special Requests   Final    NONE Performed at [facility], [address] [place], [place] [address]    Culture (A)  Final    <10,000 COLONIES/mL INSIGNIFICANT GROWTH Performed at [facility], [place] [address], [place], [place] [address]    Report Status 01/08/2021 FINAL  Final     Studies: CT Head Wo Contrast  Result Date: 01/07/2021 CLINICAL DATA:  Mental status change EXAM: CT HEAD WITHOUT CONTRAST TECHNIQUE: Contiguous axial images were obtained from the base of the skull through the vertex without intravenous contrast. COMPARISON:  None. FINDINGS: Brain: No evidence of acute territorial infarction, hemorrhage, hydrocephalus,extra-axial collection or mass lesion/mass effect. There is dilatation the ventricles and sulci consistent with age-related atrophy.  Low-attenuation changes in the deep white matter consistent with small vessel ischemia. Vascular: No hyperdense vessel or unexpected calcification. Skull: The skull is intact. No fracture or focal lesion identified. Sinuses/Orbits: The visualized paranasal sinuses and mastoid air cells are clear. The orbits and globes intact. Other: None IMPRESSION: No acute intracranial abnormality. Findings consistent with age related atrophy and chronic small vessel ischemia Electronically Signed   By: Prudencio Pair M.D.   On: 01/07/2021 02:00   CT ABDOMEN PELVIS W CONTRAST  Result Date: 01/07/2021 CLINICAL DATA:  87 year old male with right lower quadrant abdominal pain. Weight loss. EXAM: CT ABDOMEN AND PELVIS WITH CONTRAST TECHNIQUE: Multidetector CT imaging of the abdomen and pelvis was performed using the standard protocol following bolus administration of intravenous contrast. CONTRAST:  121m OMNIPAQUE IOHEXOL 300 MG/ML  SOLN COMPARISON:  None. FINDINGS: Evaluation  is limited due to streak artifact caused by patient's arms. Lower chest: Partially visualized moderate bilateral pleural effusions with partial compressive atelectasis of the lower lobes. Pneumonia is not excluded clinical correlation is recommended. There is advanced multi vessel coronary vascular calcification and partially visualized pericardial effusion measuring 1 cm in thickness. No intra-abdominal free air. Mild diffuse mesenteric edema. No free fluid. Hepatobiliary: Multiple small hepatic hypodense lesions not characterized on this CT but the larger lesion measuring 16 mm demonstrates fluid attenuation consistent with a cyst. Scattered subcentimeter lesions are too small to characterize. There is morphologic changes of cirrhosis with mild biliary ductal dilatation versus mild periportal edema. The gallbladder is unremarkable. Pancreas: Unremarkable. No pancreatic ductal dilatation or surrounding inflammatory changes. Spleen: Normal in size without  focal abnormality. Adrenals/Urinary Tract: The adrenal glands unremarkable. There is no hydronephrosis on either side. There is symmetric enhancement and excretion of contrast by both kidneys. The visualized ureters appear unremarkable. There is diffuse trabeculated appearance of the bladder wall consistent with chronic bladder outlet obstruction. Correlation with urinalysis recommended to exclude cystitis. Stomach/Bowel: There is moderate stool throughout the colon. There is diffuse colonic diverticulosis without active inflammatory changes. There is no bowel obstruction. The appendix is normal. Vascular/Lymphatic: Advanced aortoiliac atherosclerotic disease. The IVC is unremarkable. No portal venous gas. There is a 3.5 x 3.3 cm rounded mass with apparent central hypoenhancement to the left of the distal abdominal aorta (45/2) most likely a centrally necrotic enlarged lymph node. There is a 4.3 x 4.4 cm rounded solid mass along the right pelvic side wall likely an enlarged lymph node versus a malignancy such as sarcoma. Reproductive: Enlarged and heterogeneous prostate gland with areas of nodularity most consistent with prostate cancer. Correlation with clinical exam and PSA levels recommended. Other: Diffuse subcutaneous edema and anasarca. No fluid collection. Musculoskeletal: Diffuse sclerotic metastatic disease. No acute osseous pathology. IMPRESSION: 1. Findings most consistent with prostate cancer with extensive osseous metastasis. Correlation with clinical exam and PSA levels recommended. 2. Rounded soft tissue masses to the left of the distal abdominal aorta and along the right iliac chain most consistent with metastatic adenopathy. 3. Diffuse colonic diverticulosis. No bowel obstruction. Normal appendix. 4. Cirrhosis. 5. Partially visualized moderate bilateral pleural effusions with partial compressive atelectasis of the lower lobes. 6. Aortic Atherosclerosis (ICD10-I70.0). Electronically Signed   By:  Anner Crete M.D.   On: 01/07/2021 02:10   DG Chest Portable 1 View  Result Date: 01/07/2021 CLINICAL DATA:  Weakness EXAM: PORTABLE CHEST 1 VIEW COMPARISON:  None. FINDINGS: The heart size and mediastinal contours are mildly enlarged with aortic knob calcifications are seen. There is prominence of the central pulmonary vasculature. Small bilateral pleural effusions are seen, left greater than right. The visualized skeletal structures are unremarkable. IMPRESSION: Mild pulmonary vascular congestion and small bilateral pleural effusions. Electronically Signed   By: Prudencio Pair M.D.   On: 01/07/2021 01:01      Flora Lipps, MD  [facility] 01/08/2021  If 7PM-7AM, please contact night-coverage

## 2021-01-08 NOTE — Evaluation (Signed)
Physical Therapy Evaluation Patient Details Name: Jamie Potter MRN: [id number] DOB: 05-08-1930 Today's Date: 01/08/2021   History of Present Illness  Jamie Potter is a 87 y.o. male with medical history significant of hypertension, hyperlipidemia presents to the emergency department 01/07/21  for evaluation of weakness,  Acute symptomatic electrolyte abnormalities, metabolic encephalopathy, incidental finding of prostate mass. Lives alone with frequent checks by family, ambulates with cane.  Clinical Impression  Patient is resting in bed, daughter present and  provides information related to patient's PLOF.Marland Kitchen Patient pleasantly confused. Patient  Required min/mod assistance for mobility and ambulating with RW x 20'. Patient lives alone with checks by family. Patient currently will require 24/7 caregivers.  Pt admitted with above diagnosis.   Pt currently with functional limitations due to the deficits listed below (see PT Problem List). Pt will benefit from skilled PT to increase their independence and safety with mobility to allow discharge to the venue listed below.     Follow Up Recommendations SNF if does not have 24/7; vs Home health PT;Supervision/Assistance - 24 hour    Equipment Recommendations  Rolling walker with 5" wheels    Recommendations for Other Services       Precautions / Restrictions Precautions Precautions: Fall      Mobility  Bed Mobility Overal bed mobility: Needs Assistance Bed Mobility: Supine to Sit     Supine to sit: HOB elevated;Min assist     General bed mobility comments: extra time, multimodal cues, faciltate  to move legs to bed edge, min assistance to sit upright.    Transfers Overall transfer level: Needs assistance Equipment used: Rolling walker (2 wheeled) Transfers: Sit to/from Stand Sit to Stand: Mod assist         General transfer comment: multimodal cues to rise, steady assistance, posterior bias.  Ambulation/Gait Ambulation/Gait  assistance: Min assist Gait Distance (Feet): 20 Feet Assistive device: Rolling walker (2 wheeled) Gait Pattern/deviations: Step-to pattern;Step-through pattern;Drifts right/left;Trunk flexed Gait velocity: decr   General Gait Details: assistnace with RW to turn, cues to stand  upright and stay within RW .  Stairs            Wheelchair Mobility    Modified Rankin (Stroke Patients Only)       Balance Overall balance assessment: Needs assistance Sitting-balance support: Bilateral upper extremity supported;Feet supported Sitting balance-Leahy Scale: Fair     Standing balance support: During functional activity;Single extremity supported Standing balance-Leahy Scale: Poor Standing balance comment: posterior bias, reliant on UE support.                             Pertinent Vitals/Pain Pain Assessment: No/denies pain    Home Living Family/patient expects to be discharged to:: Private residence Living Arrangements: Alone Available Help at Discharge: Available PRN/intermittently Type of Home: House Home Access: Stairs to enter Entrance Stairs-Rails: None Entrance Stairs-Number of Steps: 2 Home Layout: One level Home Equipment: Cane - single point      Prior Function Level of Independence: Independent         Comments: does not drive,     Hand Dominance        Extremity/Trunk Assessment        Lower Extremity Assessment Lower Extremity Assessment: Generalized weakness    Cervical / Trunk Assessment Cervical / Trunk Assessment: Normal  Communication   Communication: No difficulties (somewhat stammers)  Cognition Arousal/Alertness: Awake/alert Behavior During Therapy: WFL for tasks assessed/performed Overall Cognitive  Status: Impaired/Different from baseline Area of Impairment: Orientation;Attention;Memory                 Orientation Level: Place;Time;Situation Current Attention Level: Sustained Memory: Decreased short-term  memory         General Comments: patient  does follow directions with increased time and repeated to follow through.      General Comments      Exercises     Assessment/Plan    PT Assessment Patient needs continued PT services  PT Problem List Decreased strength;Decreased mobility;Decreased knowledge of precautions;Decreased safety awareness;Decreased activity tolerance;Decreased cognition;Decreased balance;Decreased knowledge of use of DME       PT Treatment Interventions DME instruction;Gait training;Functional mobility training;Therapeutic activities;Therapeutic exercise;Patient/family education    PT Goals (Current goals can be found in the Care Plan section)  Acute Rehab PT Goals Patient Stated Goal: go home with some help PT Goal Formulation: With patient/family Time For Goal Achievement: 01/22/21 Potential to Achieve Goals: Good    Frequency Min 2X/week   Barriers to discharge Decreased caregiver support      Co-evaluation PT/OT/SLP Co-Evaluation/Treatment: Yes Reason for Co-Treatment: For patient/therapist safety PT goals addressed during session: Mobility/safety with mobility OT goals addressed during session: ADL's and self-care       AM-PAC PT "6 Clicks" Mobility  Outcome Measure Help needed turning from your back to your side while in a flat bed without using bedrails?: A Little Help needed moving from lying on your back to sitting on the side of a flat bed without using bedrails?: A Little Help needed moving to and from a bed to a chair (including a wheelchair)?: A Little Help needed standing up from a chair using your arms (e.g., wheelchair or bedside chair)?: A Little Help needed to walk in hospital room?: A Lot Help needed climbing 3-5 steps with a railing? : A Lot 6 Click Score: 16    End of Session Equipment Utilized During Treatment: Gait belt Activity Tolerance: Patient tolerated treatment well Patient left: in chair;with call bell/phone  within reach;with chair alarm set;with family/visitor present Nurse Communication: Mobility status PT Visit Diagnosis: Difficulty in walking, not elsewhere classified (R26.2);Unsteadiness on feet (R26.81)    Time: 8768-1157 PT Time Calculation (min) (ACUTE ONLY): 30 min   Charges:   PT Evaluation $PT Eval Low Complexity: Olds PT Acute Rehabilitation Services Pager [phone] Office [phone]   [name] 01/08/2021, 9:36 AM

## 2021-01-08 NOTE — Evaluation (Signed)
Occupational Therapy Evaluation Patient Details Name: Jamie Potter MRN: [id number] DOB: Jun 15, 1930 Today's Date: 01/08/2021    History of Present Illness Jamie Potter is a 87 y.o. male with medical history significant of hypertension, hyperlipidemia presents to the emergency department 01/07/21  for evaluation of weakness,  Acute symptomatic electrolyte abnormalities, metabolic encephalopathy, incidental finding of prostate mass. Lives alone with frequent checks by family, ambulates with cane.   Clinical Impression   Jamie Potter is an 87 year old man who presents with generalized weakness, decreased activity tolerance, impaired balance and confusion. On evaluation he needed multimodal cues and some physical assistance to stand and ambulate short distance in the room with RW and to stand at sink and perform grooming task. Patient reports feeling week and needing encouragement to participate though pleasantly confused. Patient will benefit from skilled OT services while in hospital to improve deficits and learn compensatory strategies as needed in order to return to PLOF.      Follow Up Recommendations  SNF    Equipment Recommendations  3 in 1 bedside commode;Tub/shower seat    Recommendations for Other Services       Precautions / Restrictions Precautions Precautions: Fall Restrictions Weight Bearing Restrictions: No      Mobility Bed Mobility Overal bed mobility: Needs Assistance Bed Mobility: Supine to Sit     Supine to sit: HOB elevated;Min assist     General bed mobility comments: extra time, multimodal cues, faciltate  to move legs to bed edge, min assistance to sit upright.    Transfers Overall transfer level: Needs assistance Equipment used: Rolling walker (2 wheeled) Transfers: Sit to/from Stand Sit to Stand: Mod assist         General transfer comment: multimodal cues to rise, steady assistnace, posterior bias.    Balance Overall balance assessment:  Needs assistance Sitting-balance support: Bilateral upper extremity supported;Feet supported Sitting balance-Leahy Scale: Fair     Standing balance support: During functional activity;Single extremity supported Standing balance-Leahy Scale: Poor Standing balance comment: posterior bias, reliant on UE support.                           ADL either performed or assessed with clinical judgement   ADL Overall ADL's : Needs assistance/impaired Eating/Feeding: Set up;Sitting   Grooming: Standing;Oral care;Cueing for sequencing Grooming Details (indicate cue type and reason): min assist to maintain balance at sink due to a mild posterior lean. verbal cues to perform grooming task. Upper Body Bathing: Set up;Cueing for sequencing;Sitting   Lower Body Bathing: Minimal assistance;Set up;Cueing for sequencing   Upper Body Dressing : Set up;Cueing for sequencing   Lower Body Dressing: Minimal assistance;Sit to/from stand;Cueing for sequencing;Cueing for safety   Toilet Transfer: Minimal assistance;BSC;RW                   Vision   Vision Assessment?: No apparent visual deficits     Perception     Praxis      Pertinent Vitals/Pain Pain Assessment: No/denies pain     Hand Dominance     Extremity/Trunk Assessment Upper Extremity Assessment Upper Extremity Assessment: Overall WFL for tasks assessed   Lower Extremity Assessment Lower Extremity Assessment: Defer to PT evaluation   Cervical / Trunk Assessment Cervical / Trunk Assessment: Normal   Communication Communication Communication: No difficulties (somewhat stammers)   Cognition Arousal/Alertness: Awake/alert Behavior During Therapy: WFL for tasks assessed/performed Overall Cognitive Status: Impaired/Different from baseline Area of  Impairment: Orientation;Attention;Memory                 Orientation Level: Place;Time;Situation Current Attention Level: Sustained Memory: Decreased short-term  memory         General Comments: patient  does follow directions with increased time and repeated to follow through.   General Comments       Exercises     Shoulder Instructions      Home Living Family/patient expects to be discharged to:: Private residence Living Arrangements: Alone Available Help at Discharge: Available PRN/intermittently Type of Home: House Home Access: Stairs to enter [employer] of Steps: 2 Entrance Stairs-Rails: None Home Layout: One level               Home Equipment: Cane - single point          Prior Functioning/Environment Level of Independence: Independent        Comments: does not drive,        OT Problem List: Decreased activity tolerance;Decreased strength;Impaired balance (sitting and/or standing);Decreased knowledge of use of DME or AE;Decreased safety awareness;Decreased cognition      OT Treatment/Interventions: Self-care/ADL training;Therapeutic exercise;Energy conservation;DME and/or AE instruction;Therapeutic activities;Cognitive remediation/compensation;Patient/family education;Balance training    OT Goals(Current goals can be found in the care plan section) Acute Rehab OT Goals Patient Stated Goal: go home with some help OT Goal Formulation: With patient Time For Goal Achievement: 01/22/21 Potential to Achieve Goals: Good  OT Frequency: Min 2X/week   Barriers to D/C:            Co-evaluation PT/OT/SLP Co-Evaluation/Treatment: Yes Reason for Co-Treatment: To address functional/ADL transfers;For patient/therapist safety;Necessary to address cognition/behavior during functional activity PT goals addressed during session: Mobility/safety with mobility OT goals addressed during session: ADL's and self-care      AM-PAC OT "6 Clicks" Daily Activity     Outcome Measure Help from another person eating meals?: A Little Help from another person taking care of personal grooming?: A Little Help from another  person toileting, which includes using toliet, bedpan, or urinal?: A Little Help from another person bathing (including washing, rinsing, drying)?: A Little Help from another person to put on and taking off regular upper body clothing?: A Little Help from another person to put on and taking off regular lower body clothing?: A Little 6 Click Score: 18   End of Session Equipment Utilized During Treatment: Gait belt;Rolling walker Nurse Communication: Mobility status  Activity Tolerance: Patient tolerated treatment well Patient left: in chair;with call bell/phone within reach;with chair alarm set;with family/visitor present  OT Visit Diagnosis: Unsteadiness on feet (R26.81);Other symptoms and signs involving cognitive function                Time: 0981-1914 OT Time Calculation (min): 16 min Charges:  OT General Charges $OT Visit: 1 Visit OT Evaluation $OT Eval Moderate Complexity: 1 Mod  Daxter Paule, OTR/L [place]  Office [phone] Pager: [place] 01/08/2021, 12:24 PM

## 2021-01-09 DIAGNOSIS — N4289 Other specified disorders of prostate: Secondary | ICD-10-CM | POA: Diagnosis not present

## 2021-01-09 DIAGNOSIS — E876 Hypokalemia: Secondary | ICD-10-CM | POA: Diagnosis not present

## 2021-01-09 DIAGNOSIS — R531 Weakness: Secondary | ICD-10-CM | POA: Diagnosis not present

## 2021-01-09 DIAGNOSIS — R627 Adult failure to thrive: Secondary | ICD-10-CM | POA: Diagnosis not present

## 2021-01-09 LAB — CBC
HCT: 24.3 % — ABNORMAL LOW (ref 39.0–52.0)
Hemoglobin: 7.8 g/dL — ABNORMAL LOW (ref 13.0–17.0)
MCH: 28.6 pg (ref 26.0–34.0)
MCHC: 32.1 g/dL (ref 30.0–36.0)
MCV: 89 fL (ref 80.0–100.0)
Platelets: 214 10*3/uL (ref 150–400)
RBC: 2.73 MIL/uL — ABNORMAL LOW (ref 4.22–5.81)
RDW: 17.2 % — ABNORMAL HIGH (ref 11.5–15.5)
WBC: 5 10*3/uL (ref 4.0–10.5)
nRBC: 0 % (ref 0.0–0.2)

## 2021-01-09 LAB — COMPREHENSIVE METABOLIC PANEL
ALT: 8 U/L (ref 0–44)
AST: 16 U/L (ref 15–41)
Albumin: 2.7 g/dL — ABNORMAL LOW (ref 3.5–5.0)
Alkaline Phosphatase: 240 U/L — ABNORMAL HIGH (ref 38–126)
Anion gap: 9 (ref 5–15)
BUN: 12 mg/dL (ref 8–23)
CO2: 23 mmol/L (ref 22–32)
Calcium: 8.1 mg/dL — ABNORMAL LOW (ref 8.9–10.3)
Chloride: 101 mmol/L (ref 98–111)
Creatinine, Ser: 0.54 mg/dL — ABNORMAL LOW (ref 0.61–1.24)
GFR, Estimated: >60 mL/min (ref >60)
Glucose, Bld: 113 mg/dL — ABNORMAL HIGH (ref 70–99)
Potassium: 3.6 mmol/L (ref 3.5–5.1)
Sodium: 133 mmol/L — ABNORMAL LOW (ref 135–145)
Total Bilirubin: 1.6 mg/dL — ABNORMAL HIGH (ref 0.3–1.2)
Total Protein: 6.3 g/dL — ABNORMAL LOW (ref 6.5–8.1)

## 2021-01-09 LAB — PHOSPHORUS: Phosphorus: 2 mg/dL — ABNORMAL LOW (ref 2.5–4.6)

## 2021-01-09 LAB — MAGNESIUM: Magnesium: 1.7 mg/dL (ref 1.7–2.4)

## 2021-01-09 MED ORDER — ENSURE ENLIVE PO LIQD
237.0000 mL | Freq: Three times a day (TID) | ORAL | Status: DC
  Administered 2021-01-09 – 2021-01-13 (×9): 237 mL via ORAL

## 2021-01-09 MED ORDER — MELATONIN 5 MG PO TABS
5.0000 mg | ORAL_TABLET | Freq: Every day | ORAL | Status: DC
  Administered 2021-01-09 – 2021-01-12 (×4): 5 mg via ORAL
  Filled 2021-01-09 (×4): qty 1

## 2021-01-09 MED ORDER — MAGNESIUM OXIDE 400 (241.3 MG) MG PO TABS
400.0000 mg | ORAL_TABLET | Freq: Two times a day (BID) | ORAL | Status: DC
  Administered 2021-01-09 – 2021-01-13 (×8): 400 mg via ORAL
  Filled 2021-01-09 (×8): qty 1

## 2021-01-09 MED ORDER — MIRTAZAPINE 15 MG PO TABS
15.0000 mg | ORAL_TABLET | Freq: Every day | ORAL | Status: DC
  Administered 2021-01-09 – 2021-01-12 (×4): 15 mg via ORAL
  Filled 2021-01-09 (×4): qty 1

## 2021-01-09 MED ORDER — ADULT MULTIVITAMIN W/MINERALS CH
1.0000 | ORAL_TABLET | Freq: Every day | ORAL | Status: DC
  Administered 2021-01-09 – 2021-01-13 (×5): 1 via ORAL
  Filled 2021-01-09 (×5): qty 1

## 2021-01-09 MED ORDER — POTASSIUM CHLORIDE CRYS ER 20 MEQ PO TBCR
40.0000 meq | EXTENDED_RELEASE_TABLET | Freq: Once | ORAL | Status: AC
  Administered 2021-01-09: 40 meq via ORAL
  Filled 2021-01-09: qty 2

## 2021-01-09 MED ORDER — K PHOS MONO-SOD PHOS DI & MONO 155-852-130 MG PO TABS
500.0000 mg | ORAL_TABLET | Freq: Three times a day (TID) | ORAL | Status: AC
  Administered 2021-01-09 – 2021-01-10 (×5): 500 mg via ORAL
  Filled 2021-01-09 (×6): qty 2

## 2021-01-09 NOTE — Progress Notes (Signed)
PROGRESS NOTE  Jamie Potter WSF:[id number] DOB: Nov 23, 1929 DOA: 01/07/2021 PCP: Iva Lento, PA-C   LOS: 2 days   Brief narrative:  Jamie Potter is a 87 y.o. male with medical history significant of hypertension, hyperlipidemia presented to hospital with generalized weakness, difficulty ambulating, poor oral intake, failure to thrive with significant weight loss. In the ED labs showed hypokalemia, hypocalcemia hypoalbuminemia elevated alk phos and minimally elevated T bili.  UA appears somewhat dark with rare bacteria.  Patient also notably anemic at 7.5.  Patient received IV fluids, chest x-ray CT head and abdomen pelvis  supplemental potassium 50 mEq in total.  Given profound weakness, ambulatory dysfunction poor oral intake, failure to thrive and concerning history per family patient was transferred to [facility] for further evaluation and treatment.  Assessment/Plan:  Principal Problem:   Weakness generalized Active Problems:   General weakness   Failure to thrive in adult   Prostate mass   Hypokalemia   Ambulatory dysfunction, profound weakness and debility, recent fall. Patient does have prostate mass with multiple skeletal lesions likely metastatic disease causing generalized weakness and failure to thrive.  Physical therapy recommended skilled nursing facility placement.   TSH was within normal limits.  Mild confusion disorientation.  Likely hospital acquired delirium.  Delirium precautions.  Spoke with family at bedside.  Failure to to thrive.  Likely secondary to malignancy.  Nutrition has been consulted.    Anemia.  Unknown baseline.  Likely anemia of chronic disease/malignancy.  No mention of blood per rectum.  CT scan of the abdomen pelvis showed prostate mass with multiple Osseous metastasis.   Prostate mass with multiple osteoblastic lesions in the bony skeleton.  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  Family wish to follow-up with [facility].  Offered oncology consultation here.  Family is working on getting him appointment with outpatient providers.  Hypokalemia, improved after replacement.  Check levels in a.m.  Mild hypophosphatemia.  Will replace with K-Phos.  Check levels in a.m.  Hypertension  Continue amlodipine and lisinopril   Hyperlipidemia Continue Pravachol  DVT prophylaxis: enoxaparin (LOVENOX) injection 40 mg Start: 01/07/21 1800   Code Status: Full code  Family Communication:  I spoke with one of the daughters at bedside. Status is: Inpatient  Remains inpatient appropriate because:Unsafe d/c plan, IV treatments appropriate due to intensity of illness or inability to take PO, Inpatient level of care appropriate due to severity of illness and Need for rehabilitation, new possible malignancy  Dispo: The patient is from: Home              Anticipated d/c is to: SNF              Patient currently is not medically stable to d/c.   Difficult to place patient No   Consultants:  None  Procedures:  None  Anti-infectives:  . None  Anti-infectives (From admission, onward)   None     Subjective: Today, patient was seen and examined at bedside.  Patient had some confusion and agitation yesterday.  Family reported that patient had not been able to sleep.  Objective: Vitals:   01/08/21 2129 01/09/21 0426  BP: 137/73 (!) 152/54  Pulse: (!) 59 (!) 45  Resp: 20 20  Temp: 98.4 F (36.9 C) 97.7 F (36.5 C)  SpO2: 100%     Intake/Output Summary (Last 24 hours) at 01/09/2021 0843 Last data filed at 01/08/2021 1800 Gross per 24 hour  Intake 600 ml  Output  400 ml  Net 200 ml   Filed Weights   01/07/21 0005  Weight: 59 kg   Body mass index is 19.2 kg/m.   Physical Exam: General: Thinly built, not in obvious distress, communicative, confused, elderly male HENT:   No scleral pallor or icterus noted. Oral mucosa is moist.  Chest:    Diminished breath sounds bilaterally.  No crackles or wheezes.  CVS: S1 &S2 heard. No murmur.  Regular rate and rhythm. Abdomen: Soft, nontender, nondistended.  Bowel sounds are heard.   Extremities: No cyanosis, clubbing or edema.  Peripheral pulses are palpable.  Understands. Psych: Alert awake but confused, communicative, CNS:  No cranial nerve deficits.  Moves all extremities, generalized weakness Skin: Warm and dry.  No rashes noted.   Data Review: I have personally reviewed the following laboratory data and studies,  CBC: Recent Labs  Lab 01/07/21 0053 01/08/21 0402 01/09/21 0448  WBC 5.6 4.6 5.0  NEUTROABS 3.6  --   --   HGB 7.5* 7.7* 7.8*  HCT 22.5* 24.5* 24.3*  MCV 87.9 91.8 89.0  PLT 223 192 800   Basic Metabolic Panel: Recent Labs  Lab 01/07/21 0053 01/08/21 0402 01/09/21 0448  NA 135 135 133*  K 3.0* 3.3* 3.6  CL 99 104 101  CO2 25 22 23   GLUCOSE 100* 97 113*  BUN 14 12 12   CREATININE 0.73 0.62 0.54*  CALCIUM 8.0* 8.0* 8.1*  MG 1.7  --  1.7  PHOS  --   --  2.0*   Liver Function Tests: Recent Labs  Lab 01/07/21 0053 01/08/21 0402 01/09/21 0448  AST 24 18 16   ALT 7 8 8   ALKPHOS 309* 264* 240*  BILITOT 1.6* 1.9* 1.6*  PROT 6.5 6.2* 6.3*  ALBUMIN 3.2* 2.9* 2.7*   No results for input(s): LIPASE, AMYLASE in the last 168 hours. No results for input(s): AMMONIA in the last 168 hours. Cardiac Enzymes: Recent Labs  Lab 01/07/21 0053  CKTOTAL 307   BNP (last 3 results) Recent Labs    01/07/21 0053  BNP 258.6*    ProBNP (last 3 results) No results for input(s): PROBNP in the last 8760 hours.  CBG: No results for input(s): GLUCAP in the last 168 hours. Recent Results (from the past 240 hour(s))  Resp Panel by RT-PCR (Flu A&B, Covid) Nasopharyngeal Swab     Status: None   Collection Time: 01/07/21 12:53 AM   Specimen: Nasopharyngeal Swab; Nasopharyngeal(NP) swabs in vial transport medium  Result Value Ref Range Status   SARS Coronavirus 2 by RT PCR NEGATIVE NEGATIVE Final     Comment: (NOTE) SARS-CoV-2 target nucleic acids are NOT DETECTED.  The SARS-CoV-2 RNA is generally detectable in upper respiratory specimens during the acute phase of infection. The lowest concentration of SARS-CoV-2 viral copies this assay can detect is 138 copies/mL. A negative result does not preclude SARS-Cov-2 infection and should not be used as the sole basis for treatment or other patient management decisions. A negative result may occur with  improper specimen collection/handling, submission of specimen other than nasopharyngeal swab, presence of viral mutation(s) within the areas targeted by this assay, and inadequate number of viral copies(<138 copies/mL). A negative result must be combined with clinical observations, patient history, and epidemiological information. The expected result is Negative.  Fact Sheet for Patients:  EntrepreneurPulse.com.au  Fact Sheet for Healthcare Providers:  IncredibleEmployment.be  This test is no t yet approved or cleared by the [place] and  has been authorized  for detection and/or diagnosis of SARS-CoV-2 by FDA under an Emergency Use Authorization (EUA). This EUA will remain  in effect (meaning this test can be used) for the duration of the COVID-19 declaration under Section 564(b)(1) of the Act, 21 U.S.C.section 360bbb-3(b)(1), unless the authorization is terminated  or revoked sooner.       Influenza A by PCR NEGATIVE NEGATIVE Final   Influenza B by PCR NEGATIVE NEGATIVE Final    Comment: (NOTE) The Xpert Xpress SARS-CoV-2/FLU/RSV plus assay is intended as an aid in the diagnosis of influenza from Nasopharyngeal swab specimens and should not be used as a sole basis for treatment. Nasal washings and aspirates are unacceptable for Xpert Xpress SARS-CoV-2/FLU/RSV testing.  Fact Sheet for Patients: EntrepreneurPulse.com.au  Fact Sheet for Healthcare  Providers: IncredibleEmployment.be  This test is not yet approved or cleared by the [place] FDA and has been authorized for detection and/or diagnosis of SARS-CoV-2 by FDA under an Emergency Use Authorization (EUA). This EUA will remain in effect (meaning this test can be used) for the duration of the COVID-19 declaration under Section 564(b)(1) of the Act, 21 U.S.C. section 360bbb-3(b)(1), unless the authorization is terminated or revoked.  Performed at [facility], [address], [place], [place] [address]   Urine culture     Status: Abnormal   Collection Time: 01/07/21 10:19 AM   Specimen: Urine, Random  Result Value Ref Range Status   Specimen Description   Final    URINE, RANDOM Performed at [facility], [address] [place], [place] [address]    Special Requests   Final    NONE Performed at [facility], [address] [place], [place] [address]    Culture (A)  Final    <10,000 COLONIES/mL INSIGNIFICANT GROWTH Performed at [facility], [place] [address], [place], [place] [address]    Report Status 01/08/2021 FINAL  Final     Studies: No results found.    Flora Lipps, MD  [facility] 01/09/2021  If 7PM-7AM, please contact night-coverage

## 2021-01-09 NOTE — Progress Notes (Addendum)
Initial Nutrition Assessment  DOCUMENTATION CODES:   Not applicable  INTERVENTION:   Monitor magnesium, potassium, and phosphorus BID for at least 2 days, MD to replete as needed, as pt is at risk for refeeding syndrome given significant weight loss with low potassium and phosphorus.  Ensure Enlive po TID, each supplement provides 350 kcal and 20 grams of protein.  MVI with minerals daily.  NUTRITION DIAGNOSIS:   Increased nutrient needs related to chronic illness (prostate mass with suspected metastatic disease) as evidenced by estimated needs.  GOAL:   Patient will meet greater than or equal to 90% of their needs  MONITOR:   PO intake,Supplement acceptance,Labs  REASON FOR ASSESSMENT:   Consult Assessment of nutrition requirement/status  ASSESSMENT:   87 yo male admitted with generalized weakness, failure to thrive, hypokalemia, hypocalcemia. PMH includes HTN, HLD, prostate mass with mulitple skeletal lesions (likely metastatic disease).   Currently on a regular diet. Meal intakes: 100% of breakfast and dinner 3/25, 0% of lunch 3/25.   Potassium 3.3 low on admission, up to 3.6 WNL today.  Labs reviewed. Na 133, phos 2   Medications reviewed and include Colace, K Phos, Miralax.  No weight history available for review.  Per MD notes, patient has had significant weight loss. Currently 19% below ideal weight.   Unable to speak with patient to obtain nutrition hx at this time.   NUTRITION - FOCUSED PHYSICAL EXAM:  unable to complete  Diet Order:   Diet Order            Diet regular Room service appropriate? Yes; Fluid consistency: Thin  Diet effective now                 EDUCATION NEEDS:   Not appropriate for education at this time  Skin:  Skin Assessment: Reviewed RN Assessment  Last BM:  no BM documented  Height:   Ht Readings from Last 1 Encounters:  01/07/21 5\' 9"  (1.753 m)    Weight:   Wt Readings from Last 1 Encounters:  01/07/21 59  kg    Ideal Body Weight:  72.7 kg  BMI:  Body mass index is 19.2 kg/m.  Estimated Nutritional Needs:   Kcal:  2100-2300  Protein:  100-120 gm  Fluid:  >/= 2 L    Lucas Mallow, RD, LDN, CNSC Please refer to Amion for contact information.

## 2021-01-09 NOTE — Progress Notes (Signed)
Pt  has been setting off bed alarm all night, calling to people in the hallway, repeatedly having [place] look for a radio that we were unable to find, and became progressively more confused and less redirectable over the course of the night. Around 0230, pt had swung leg over the side and it became caught between the two bedrails. Pt stated he was not in any pain, nurse and tech had some difficulty removing pt's leg and putting it back in bed. Pt still said he was not in any pain. Order received from MD to restrain pt.

## 2021-01-10 DIAGNOSIS — R627 Adult failure to thrive: Secondary | ICD-10-CM | POA: Diagnosis not present

## 2021-01-10 DIAGNOSIS — E876 Hypokalemia: Secondary | ICD-10-CM | POA: Diagnosis not present

## 2021-01-10 DIAGNOSIS — R531 Weakness: Secondary | ICD-10-CM | POA: Diagnosis not present

## 2021-01-10 DIAGNOSIS — N4289 Other specified disorders of prostate: Secondary | ICD-10-CM | POA: Diagnosis not present

## 2021-01-10 LAB — CBC
HCT: 25.6 % — ABNORMAL LOW (ref 39.0–52.0)
Hemoglobin: 8.3 g/dL — ABNORMAL LOW (ref 13.0–17.0)
MCH: 29.1 pg (ref 26.0–34.0)
MCHC: 32.4 g/dL (ref 30.0–36.0)
MCV: 89.8 fL (ref 80.0–100.0)
Platelets: 232 10*3/uL (ref 150–400)
RBC: 2.85 MIL/uL — ABNORMAL LOW (ref 4.22–5.81)
RDW: 17 % — ABNORMAL HIGH (ref 11.5–15.5)
WBC: 4.9 10*3/uL (ref 4.0–10.5)
nRBC: 0 % (ref 0.0–0.2)

## 2021-01-10 LAB — COMPREHENSIVE METABOLIC PANEL
ALT: 8 U/L (ref 0–44)
AST: 15 U/L (ref 15–41)
Albumin: 2.7 g/dL — ABNORMAL LOW (ref 3.5–5.0)
Alkaline Phosphatase: 245 U/L — ABNORMAL HIGH (ref 38–126)
Anion gap: 9 (ref 5–15)
BUN: 12 mg/dL (ref 8–23)
CO2: 25 mmol/L (ref 22–32)
Calcium: 8.2 mg/dL — ABNORMAL LOW (ref 8.9–10.3)
Chloride: 100 mmol/L (ref 98–111)
Creatinine, Ser: 0.78 mg/dL (ref 0.61–1.24)
GFR, Estimated: >60 mL/min (ref >60)
Glucose, Bld: 112 mg/dL — ABNORMAL HIGH (ref 70–99)
Potassium: 4.4 mmol/L (ref 3.5–5.1)
Sodium: 134 mmol/L — ABNORMAL LOW (ref 135–145)
Total Bilirubin: 0.9 mg/dL (ref 0.3–1.2)
Total Protein: 6.3 g/dL — ABNORMAL LOW (ref 6.5–8.1)

## 2021-01-10 LAB — MAGNESIUM: Magnesium: 1.8 mg/dL (ref 1.7–2.4)

## 2021-01-10 LAB — PHOSPHORUS: Phosphorus: 2.7 mg/dL (ref 2.5–4.6)

## 2021-01-10 NOTE — Progress Notes (Signed)
PROGRESS NOTE  Jamie Potter YQI:[id number] DOB: 1930/04/29 DOA: 01/07/2021 PCP: Iva Lento, PA-C   LOS: 3 days   Brief narrative:  Jamie Potter is a 87 y.o. male with medical history significant of hypertension, hyperlipidemia presented to hospital with generalized weakness, difficulty ambulating, poor oral intake, failure to thrive with significant weight loss. In the ED labs showed hypokalemia, hypocalcemia, hypoalbuminemia, elevated alk phos and minimally elevated T bili.  UA appears somewhat dark with rare bacteria.  Patient also notably anemic at 7.5.  Patient received IV fluids, chest x-ray CT head and abdomen pelvis  supplemental potassium 50 mEq in total.  Given profound weakness, ambulatory dysfunction, poor oral intake, failure to thrive and concerning history per family patient was transferred to [facility] for further evaluation and treatment.  Assessment/Plan:  Principal Problem:   Weakness generalized Active Problems:   General weakness   Failure to thrive in adult   Prostate mass   Hypokalemia  Ambulatory dysfunction, profound weakness and debility, recent fall. Patient does have prostate mass with multiple skeletal lesions likely metastatic disease causing generalized weakness and failure to thrive.  Physical therapy recommended skilled nursing facility placement.   TSH was within normal limits.  Bradycardia.  Mild.  Will avoid beta-blockers.  Hemodynamically stable.  EKG showed QT prolongation.  Mild confusion, disorientation.  Likely hospital acquired delirium.  Delirium precautions.  Spoke with family at bedside.  Has some restraints.  Still communicative but confused.  Patient was started on Remeron due to lack of sleep and agitation confusion.  Failure to to thrive.  Likely secondary to malignancy.  Nutrition has been consulted.  Encourage oral nutrition.  Anemia.  Unknown baseline.  Likely anemia of chronic disease/malignancy.  No mention of  blood per rectum.  CT scan of the abdomen pelvis showed prostate mass with multiple osseous metastasis.   Prostate mass with multiple osteoblastic lesions in the bony skeleton.  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  Family wish to follow-up with [facility].  Offered oncology consultation here.  Family is working on getting him appointment with outpatient providers.  Hypokalemia, improved after replacement.  Potassium 4.4 today.  We will continue to monitor.  Mild hypophosphatemia.  Improved with potassium phosphate supplements.  Phosphate of 2.7 today.  Hypertension  Continue amlodipine and lisinopril.  Blood pressure elevated.  We will continue to adjust medications as necessary.  Hyperlipidemia Continue Pravachol  DVT prophylaxis: enoxaparin (LOVENOX) injection 40 mg Start: 01/07/21 1800   Code Status: Full code  Family Communication:  None today  Status is: Inpatient  Remains inpatient appropriate because:Unsafe d/c plan, IV treatments appropriate due to intensity of illness or inability to take PO, Inpatient level of care appropriate due to severity of illness and Need for rehabilitation, new possible malignancy  Dispo: The patient is from: Home              Anticipated d/c is to: SNF as per physical therapy recommendation.              Patient currently is not medically stable to d/c.   Difficult to place patient No   Consultants:  None  Procedures:  None  Anti-infectives:  . None  Anti-infectives (From admission, onward)   None     Subjective: The patient was seen and examined at bedside.  Patient appears to be confused at times.  On restraints.  Denies any pain, nausea.  Had not slept in few days but did sleep some  today.  Objective: Vitals:   01/10/21 0644 01/10/21 1126  BP: (!) 172/46 (!) 153/52  Pulse: (!) 42 (!) 45  Resp:  16  Temp: (!) 97.4 F (36.3 C)   SpO2: (!) 88%     Intake/Output Summary (Last 24 hours) at  01/10/2021 1132 Last data filed at 01/10/2021 0647 Gross per 24 hour  Intake 360 ml  Output 1375 ml  Net -1015 ml   Filed Weights   01/07/21 0005  Weight: 59 kg   Body mass index is 19.2 kg/m.   Physical Exam: General: Thinly built, not in obvious distress, communicative, confused, elderly male HENT:   No scleral pallor or icterus noted. Oral mucosa is moist.  Chest:    Diminished breath sounds bilaterally. No crackles or wheezes.  CVS: S1 &S2 heard. No murmur.  Regular rate and rhythm. Abdomen: Soft, nontender, nondistended.  Bowel sounds are heard.   Extremities: No cyanosis, clubbing or edema.  Peripheral pulses are palpable.  Understands. Psych: Alert awake but confused, communicative, CNS:  No cranial nerve deficits.  Moving all extremities. Skin: Warm and dry.  No rashes noted.   Data Review: I have personally reviewed the following laboratory data and studies,  CBC: Recent Labs  Lab 01/07/21 0053 01/08/21 0402 01/09/21 0448 01/10/21 0409  WBC 5.6 4.6 5.0 4.9  NEUTROABS 3.6  --   --   --   HGB 7.5* 7.7* 7.8* 8.3*  HCT 22.5* 24.5* 24.3* 25.6*  MCV 87.9 91.8 89.0 89.8  PLT 223 192 214 574   Basic Metabolic Panel: Recent Labs  Lab 01/07/21 0053 01/08/21 0402 01/09/21 0448 01/10/21 0409  NA 135 135 133* 134*  K 3.0* 3.3* 3.6 4.4  CL 99 104 101 100  CO2 _0 GLUCOSE 100* 97 113* 112*  BUN _1 CREATININE 0.73 0.62 0.54* 0.78  CALCIUM 8.0* 8.0* 8.1* 8.2*  MG 1.7  --  1.7 1.8  PHOS  --   --  2.0* 2.7   Liver Function Tests: Recent Labs  Lab 01/07/21 0053 01/08/21 0402 01/09/21 0448 01/10/21 0409  AST _2 ALT _3 ALKPHOS 309* 264* 240* 245*  BILITOT 1.6* 1.9* 1.6* 0.9  PROT 6.5 6.2* 6.3* 6.3*  ALBUMIN 3.2* 2.9* 2.7* 2.7*   No results for input(s): LIPASE, AMYLASE in the last 168 hours. No results for input(s): AMMONIA in the last 168 hours. Cardiac Enzymes: Recent Labs  Lab 01/07/21 0053  CKTOTAL 307   BNP  (last 3 results) Recent Labs    01/07/21 0053  BNP 258.6*    ProBNP (last 3 results) No results for input(s): PROBNP in the last 8760 hours.  CBG: No results for input(s): GLUCAP in the last 168 hours. Recent Results (from the past 240 hour(s))  Resp Panel by RT-PCR (Flu A&B, Covid) Nasopharyngeal Swab     Status: None   Collection Time: 01/07/21 12:53 AM   Specimen: Nasopharyngeal Swab; Nasopharyngeal(NP) swabs in vial transport medium  Result Value Ref Range Status   SARS Coronavirus 2 by RT PCR NEGATIVE NEGATIVE Final    Comment: (NOTE) SARS-CoV-2 target nucleic acids are NOT DETECTED.  The SARS-CoV-2 RNA is generally detectable in upper respiratory specimens during the acute phase of infection. The lowest concentration of SARS-CoV-2 viral copies this assay can detect is 138 copies/mL. A negative result does not preclude SARS-Cov-2 infection and should not be used as the sole basis for treatment  or other patient management decisions. A negative result may occur with  improper specimen collection/handling, submission of specimen other than nasopharyngeal swab, presence of viral mutation(s) within the areas targeted by this assay, and inadequate number of viral copies(<138 copies/mL). A negative result must be combined with clinical observations, patient history, and epidemiological information. The expected result is Negative.  Fact Sheet for Patients:  EntrepreneurPulse.com.au  Fact Sheet for Healthcare Providers:  IncredibleEmployment.be  This test is no t yet approved or cleared by the [place] FDA and  has been authorized for detection and/or diagnosis of SARS-CoV-2 by FDA under an Emergency Use Authorization (EUA). This EUA will remain  in effect (meaning this test can be used) for the duration of the COVID-19 declaration under Section 564(b)(1) of the Act, 21 U.S.C.section 360bbb-3(b)(1), unless the authorization is  terminated  or revoked sooner.       Influenza A by PCR NEGATIVE NEGATIVE Final   Influenza B by PCR NEGATIVE NEGATIVE Final    Comment: (NOTE) The Xpert Xpress SARS-CoV-2/FLU/RSV plus assay is intended as an aid in the diagnosis of influenza from Nasopharyngeal swab specimens and should not be used as a sole basis for treatment. Nasal washings and aspirates are unacceptable for Xpert Xpress SARS-CoV-2/FLU/RSV testing.  Fact Sheet for Patients: EntrepreneurPulse.com.au  Fact Sheet for Healthcare Providers: IncredibleEmployment.be  This test is not yet approved or cleared by the [place] FDA and has been authorized for detection and/or diagnosis of SARS-CoV-2 by FDA under an Emergency Use Authorization (EUA). This EUA will remain in effect (meaning this test can be used) for the duration of the COVID-19 declaration under Section 564(b)(1) of the Act, 21 U.S.C. section 360bbb-3(b)(1), unless the authorization is terminated or revoked.  Performed at [facility], [address], [place], [place] [address]   Urine culture     Status: Abnormal   Collection Time: 01/07/21 10:19 AM   Specimen: Urine, Random  Result Value Ref Range Status   Specimen Description   Final    URINE, RANDOM Performed at [facility], [address] [place], [place] [address]    Special Requests   Final    NONE Performed at [facility], [address] [place], [place] [address]    Culture (A)  Final    <10,000 COLONIES/mL INSIGNIFICANT GROWTH Performed at [facility], [place] [address] [place], [place] [address]    Report Status 01/08/2021 FINAL  Final     Studies: No results found.    Flora Lipps, MD  [facility] 01/10/2021  If 7PM-7AM, please contact night-coverage

## 2021-01-10 NOTE — Progress Notes (Signed)
Patient resting, arouses to stimuli. RR even and unlabored. Encouraged son to not wake him up and let him rest, as the past couple of nights he hadn't had slept much, condom catheter, remains dry and intact, bilateral restraints in replace, loosened. No other needs identified. Will continue to monitor.

## 2021-01-10 NOTE — Progress Notes (Signed)
OT Cancellation Note  Patient Details Name: PEARLEY BARANEK MRN: [id number] DOB: Sep 26, 1930   Cancelled Treatment:    Reason Eval/Treat Not Completed: Other (comment). Patient asleep. Patient hasn't slept the last couple of nights, has had increasing confusion and required restraints. Will allow patient to rest and f/u as able.  Leah Thornberry L Khalis Hittle 01/10/2021, 3:41 PM

## 2021-01-10 NOTE — Progress Notes (Signed)
Shift Summary:   Rested the majority of the shift,drowsy but was able to wake up follow commands when prompted, easily arrousable, able to squeeze hands, alert to self, date of birth, year, not to location/situation, released restraints, 30 minutes later patient was sitting up on the side of the bed attempting to get OOB. Made MD Dooms aware. Restraints re-applied, patient fell asleep shortly after. RR remained even and unlabored during this shift, HR remained brady during this shift.  No other needs identified. Will continue to monitor.

## 2021-01-10 NOTE — Progress Notes (Signed)
Patient sitting up at 90 degree angle coughing after drinking ensure enlive. Suspect aspiration. Speech consult ordered. Straws removed.

## 2021-01-11 ENCOUNTER — Inpatient Hospital Stay (HOSPITAL_COMMUNITY): Payer: Medicare Other

## 2021-01-11 DIAGNOSIS — R531 Weakness: Secondary | ICD-10-CM | POA: Diagnosis not present

## 2021-01-11 DIAGNOSIS — N4289 Other specified disorders of prostate: Secondary | ICD-10-CM | POA: Diagnosis not present

## 2021-01-11 DIAGNOSIS — E876 Hypokalemia: Secondary | ICD-10-CM | POA: Diagnosis not present

## 2021-01-11 DIAGNOSIS — R627 Adult failure to thrive: Secondary | ICD-10-CM | POA: Diagnosis not present

## 2021-01-11 LAB — COMPREHENSIVE METABOLIC PANEL
ALT: 7 U/L (ref 0–44)
AST: 13 U/L — ABNORMAL LOW (ref 15–41)
Albumin: 2.4 g/dL — ABNORMAL LOW (ref 3.5–5.0)
Alkaline Phosphatase: 233 U/L — ABNORMAL HIGH (ref 38–126)
Anion gap: 8 (ref 5–15)
BUN: 13 mg/dL (ref 8–23)
CO2: 26 mmol/L (ref 22–32)
Calcium: 7.7 mg/dL — ABNORMAL LOW (ref 8.9–10.3)
Chloride: 101 mmol/L (ref 98–111)
Creatinine, Ser: 0.54 mg/dL — ABNORMAL LOW (ref 0.61–1.24)
GFR, Estimated: >60 mL/min (ref >60)
Glucose, Bld: 97 mg/dL (ref 70–99)
Potassium: 3.9 mmol/L (ref 3.5–5.1)
Sodium: 135 mmol/L (ref 135–145)
Total Bilirubin: 1 mg/dL (ref 0.3–1.2)
Total Protein: 5.7 g/dL — ABNORMAL LOW (ref 6.5–8.1)

## 2021-01-11 LAB — CBC
HCT: 23.3 % — ABNORMAL LOW (ref 39.0–52.0)
Hemoglobin: 7.5 g/dL — ABNORMAL LOW (ref 13.0–17.0)
MCH: 28.3 pg (ref 26.0–34.0)
MCHC: 32.2 g/dL (ref 30.0–36.0)
MCV: 87.9 fL (ref 80.0–100.0)
Platelets: 244 10*3/uL (ref 150–400)
RBC: 2.65 MIL/uL — ABNORMAL LOW (ref 4.22–5.81)
RDW: 17.2 % — ABNORMAL HIGH (ref 11.5–15.5)
WBC: 5.4 10*3/uL (ref 4.0–10.5)
nRBC: 0 % (ref 0.0–0.2)

## 2021-01-11 LAB — PHOSPHORUS: Phosphorus: 2.9 mg/dL (ref 2.5–4.6)

## 2021-01-11 LAB — MAGNESIUM: Magnesium: 1.9 mg/dL (ref 1.7–2.4)

## 2021-01-11 NOTE — TOC Progression Note (Addendum)
Transition of Care [facility]) - Progression Note    Patient Details  Name: Jamie Potter MRN: [id number] Date of Birth: [birth year]  [facility] of [facility]) CM/SW Contact  Purcell Mouton, RN Phone Number: 01/11/2021, 1:11 PM  Clinical Narrative:    SNF's are waiting to see how pt do without restraints and sitters for 48 hourst in order to make bed offers. Daughter Helene Kelp and RN was made aware.    Expected Discharge Plan: [place] Barriers to Discharge: No Barriers Identified  Expected Discharge Plan and Services Expected Discharge Plan: [place] arrangements for the past 2 months: Single Family Home                                       Social Determinants of Health (SDOH) Interventions    Readmission Risk Interventions No flowsheet data found.

## 2021-01-11 NOTE — Progress Notes (Signed)
Physical Therapy Treatment Patient Details Name: Jamie Potter MRN: [id number] DOB: [birth year] Today's Date: 01/11/2021    History of Present Illness Jamie Potter is a 87 y.o. male with medical history significant of hypertension, hyperlipidemia presents to the emergency department 01/07/21  for evaluation of weakness,  Acute symptomatic electrolyte abnormalities, metabolic encephalopathy, incidental finding of prostate mass. Lives alone with frequent checks by family, ambulates with cane.    PT Comments    The patient is restless and more cinfused, requires frequent cues and redirection. Attempted ambulation but leaning backward  And preoccupied by wanting something that  PT and  Daughter could not discern\ Continue PT for mobility/gait.  Follow Up Recommendations  SNF;Supervision/Assistance - 24 hour     Equipment Recommendations  None recommended by PT    Recommendations for Other Services       Precautions / Restrictions Precautions Precautions: Fall Precaution Comments: incontinent urine    Mobility  Bed Mobility Overal bed mobility: Needs Assistance Bed Mobility: Supine to Sit     Supine to sit: HOB elevated;Min assist     General bed mobility comments: extra time, multimodal cues, faciltate  to move legs to bed edge, min assistance to sit upright.    Transfers Overall transfer level: Needs assistance Equipment used: Rolling walker (2 wheeled) Transfers: Sit to/from Stand Sit to Stand: Mod assist         General transfer comment: multimodal cues to rise, steady assistance, posterior bias. frequent cues for  holding RW.  Ambulation/Gait Ambulation/Gait assistance: Mod assist   Assistive device: Rolling walker (2 wheeled)       General Gait Details: side steps along bed, Attempted top ambu,ate to Recliner, posterior balance loss, required to sit down onto bed.   Stairs             Wheelchair Mobility    Modified Rankin (Stroke Patients  Only)       Balance Overall balance assessment: Needs assistance Sitting-balance support: Bilateral upper extremity supported;Feet supported Sitting balance-Leahy Scale: Poor Sitting balance - Comments: falling/leaning backwards, frequent cues to lean forward   Standing balance support: Bilateral upper extremity supported;During functional activity Standing balance-Leahy Scale: Poor Standing balance comment: posterior bias, reliant on UE support. and external support                            Cognition Arousal/Alertness: Awake/alert Behavior During Therapy: Restless Overall Cognitive Status: Impaired/Different from baseline Area of Impairment: Orientation;Attention;Memory                 Orientation Level: Place;Time;Situation Current Attention Level: Focused Memory: Decreased short-term memory         General Comments: patient restless, asking for something that  he cannot make known, ? wants condo79m cath on.  Frequent cues ofr redirection      Exercises General Exercises - Lower Extremity Long Arc Quad: AAROM;Seated;Both;10 reps Hip Flexion/Marching: AAROM;Both;10 reps;Seated    General Comments        Pertinent Vitals/Pain Pain Assessment: No/denies pain    Home Living                      Prior Function            PT Goals (current goals can now be found in the care plan section) Progress towards PT goals: Progressing toward goals    Frequency    Min 2X/week  PT Plan Current plan remains appropriate    Co-evaluation              AM-PAC PT "6 Clicks" Mobility   Outcome Measure  Help needed turning from your back to your side while in a flat bed without using bedrails?: A Lot Help needed moving from lying on your back to sitting on the side of a flat bed without using bedrails?: A Lot Help needed moving to and from a bed to a chair (including a wheelchair)?: A Lot Help needed standing up from a chair using  your arms (e.g., wheelchair or bedside chair)?: A Lot Help needed to walk in hospital room?: A Lot Help needed climbing 3-5 steps with a railing? : A Lot 6 Click Score: 12    End of Session Equipment Utilized During Treatment: Gait belt Activity Tolerance: Patient tolerated treatment well Patient left: with family/visitor present;in bed;with bed alarm set Nurse Communication: Mobility status PT Visit Diagnosis: Difficulty in walking, not elsewhere classified (R26.2);Unsteadiness on feet (R26.81)     Time: 1914-7829 PT Time Calculation (min) (ACUTE ONLY): 36 min  Charges:  $Therapeutic Activity: 23-37 mins                     [name] PT Acute Rehabilitation Services Pager [phone] Office [phone]    [name] 01/11/2021, 3:42 PM

## 2021-01-11 NOTE — Progress Notes (Signed)
PROGRESS NOTE  Jamie Potter VPX:[id number] DOB: October 16, 1930 DOA: 01/07/2021 PCP: Iva Lento, PA-C   LOS: 4 days   Brief narrative:  Jamie Potter is a 87 y.o. male with medical history significant of hypertension, hyperlipidemia presented to hospital with generalized weakness, difficulty ambulating, poor oral intake, failure to thrive with significant weight loss. In the ED, labs showed hypokalemia, hypocalcemia, hypoalbuminemia, elevated alk phos and minimally elevated T bili.  UA appears somewhat dark with rare bacteria.  Patient also notably anemic at 7.5.  Patient received IV fluids, chest x-ray CT head and abdomen pelvis  supplemental potassium 50 mEq in total.  Given profound weakness, ambulatory dysfunction, poor oral intake, failure to thrive and concerning history per family patient was transferred to [facility] long hospital for further evaluation and treatment.  Assessment/Plan:  Principal Problem:   Weakness generalized Active Problems:   General weakness   Failure to thrive in adult   Prostate mass   Hypokalemia  Ambulatory dysfunction, profound weakness and debility, recent fall. Patient does have prostate mass with multiple skeletal lesions likely metastatic disease causing generalized weakness and failure to thrive.  Physical therapy recommended skilled nursing facility placement.   TSH was within normal limits.  Bradycardia.  Mild.  Will avoid beta-blockers.  Hemodynamically stable.  EKG showed QT prolongation.  Avoid QT prolonging medications as much as possible  Mild confusion, disorientation.  Likely hospital acquired delirium.  Delirium precautions.  Spoke with family at bedside.  Has some restraints.  Still communicative but confused.  Patient was started on Remeron due to lack of sleep and agitation confusion.   Hand pain and swelling.  Off restraints at this time.  Check x-ray of the hand.  Failure to to thrive.  Likely secondary to malignancy.  Nutrition on  board.  Encourage oral nutrition.  Anemia.    Likely anemia of chronic disease/malignancy   Prostate mass with multiple osteoblastic lesions in the bony skeleton.  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  Family wish to follow-up with [facility].  Offered oncology consultation here.  Family is working on getting him appointment with outpatient providers.  Hypokalemia, improved after replacement.  Potassium 4.4 today.  We will continue to monitor.  Mild hypophosphatemia.  Improved with potassium phosphate supplements.  Phosphate of 2.7 today.  Hypertension  Continue amlodipine and lisinopril.  Blood pressure elevated.  We will continue to adjust medications as necessary.  Hyperlipidemia Continue Pravachol  DVT prophylaxis: enoxaparin (LOVENOX) injection 40 mg Start: 01/07/21 1800   Code Status: Full code  Family Communication:  None  Status is: Inpatient  Remains inpatient appropriate because:Unsafe d/c plan, IV treatments appropriate due to intensity of illness or inability to take PO, Inpatient level of care appropriate due to severity of illness and Need for rehabilitation, new possible malignancy  Dispo: The patient is from: Home              Anticipated d/c is to: SNF as per physical therapy recommendation.              Patient currently is not medically stable to d/c.   Difficult to place patient No   Consultants:  None  Procedures:  None  Anti-infectives:  . None  Anti-infectives (From admission, onward)   None     Subjective: Today, patient was seen and examined at bedside.  Patient denies any nausea vomiting fever or chills.  Complains of the pain and swelling of the left hand.  Objective: Vitals:  01/10/21 2311 01/11/21 0453  BP: (!) 155/60 (!) 132/55  Pulse: 97 (!) 53  Resp:  16  Temp:  98.1 F (36.7 C)  SpO2: 93% 95%    Intake/Output Summary (Last 24 hours) at 01/11/2021 0823 Last data filed at 01/11/2021 0454 Gross  per 24 hour  Intake 300 ml  Output 825 ml  Net -525 ml   Filed Weights   01/07/21 0005  Weight: 59 kg   Body mass index is 19.2 kg/m.   Physical Exam: General: Thinly built, not in obvious distress, communicative, confused, elderly male HENT:   No scleral pallor or icterus noted. Oral mucosa is moist.  Chest:    Diminished breath sounds bilaterally. No crackles or wheezes.  CVS: S1 &S2 heard. No murmur.  Regular rate and rhythm. Abdomen: Soft, nontender, nondistended.  Bowel sounds are heard.   Extremities: No cyanosis, clubbing or edema.  Peripheral pulses are palpable.  Marland Kitchen Psych: Alert awake but confused, communicative, disoriented CNS:  No cranial nerve deficits.  Moving all extremities. Skin: Warm and dry.  No rashes noted.   Data Review: I have personally reviewed the following laboratory data and studies,  CBC: Recent Labs  Lab 01/07/21 0053 01/08/21 0402 01/09/21 0448 01/10/21 0409 01/11/21 0351  WBC 5.6 4.6 5.0 4.9 5.4  NEUTROABS 3.6  --   --   --   --   HGB 7.5* 7.7* 7.8* 8.3* 7.5*  HCT 22.5* 24.5* 24.3* 25.6* 23.3*  MCV 87.9 91.8 89.0 89.8 87.9  PLT 223 192 214 232 163   Basic Metabolic Panel: Recent Labs  Lab 01/07/21 0053 01/08/21 0402 01/09/21 0448 01/10/21 0409 01/11/21 0351  NA 135 135 133* 134* 135  K 3.0* 3.3* 3.6 4.4 3.9  CL 99 104 101 100 101  CO2 _0 GLUCOSE 100* 97 113* 112* 97  BUN _1 CREATININE 0.73 0.62 0.54* 0.78 0.54*  CALCIUM 8.0* 8.0* 8.1* 8.2* 7.7*  MG 1.7  --  1.7 1.8 1.9  PHOS  --   --  2.0* 2.7 2.9   Liver Function Tests: Recent Labs  Lab 01/07/21 0053 01/08/21 0402 01/09/21 0448 01/10/21 0409 01/11/21 0351  AST _2 13*  ALT _3 ALKPHOS 309* 264* 240* 245* 233*  BILITOT 1.6* 1.9* 1.6* 0.9 1.0  PROT 6.5 6.2* 6.3* 6.3* 5.7*  ALBUMIN 3.2* 2.9* 2.7* 2.7* 2.4*   No results for input(s): LIPASE, AMYLASE in the last 168 hours. No results for input(s): AMMONIA in the last 168  hours. Cardiac Enzymes: Recent Labs  Lab 01/07/21 0053  CKTOTAL 307   BNP (last 3 results) Recent Labs    01/07/21 0053  BNP 258.6*    ProBNP (last 3 results) No results for input(s): PROBNP in the last 8760 hours.  CBG: No results for input(s): GLUCAP in the last 168 hours. Recent Results (from the past 240 hour(s))  Resp Panel by RT-PCR (Flu A&B, Covid) Nasopharyngeal Swab     Status: None   Collection Time: 01/07/21 12:53 AM   Specimen: Nasopharyngeal Swab; Nasopharyngeal(NP) swabs in vial transport medium  Result Value Ref Range Status   SARS Coronavirus 2 by RT PCR NEGATIVE NEGATIVE Final    Comment: (NOTE) SARS-CoV-2 target nucleic acids are NOT DETECTED.  The SARS-CoV-2 RNA is generally detectable in upper respiratory specimens during the acute phase of infection. The lowest concentration of SARS-CoV-2 viral copies this assay can detect  is 138 copies/mL. A negative result does not preclude SARS-Cov-2 infection and should not be used as the sole basis for treatment or other patient management decisions. A negative result may occur with  improper specimen collection/handling, submission of specimen other than nasopharyngeal swab, presence of viral mutation(s) within the areas targeted by this assay, and inadequate number of viral copies(<138 copies/mL). A negative result must be combined with clinical observations, patient history, and epidemiological information. The expected result is Negative.  Fact Sheet for Patients:  EntrepreneurPulse.com.au  Fact Sheet for Healthcare Providers:  IncredibleEmployment.be  This test is no t yet approved or cleared by the [place] FDA and  has been authorized for detection and/or diagnosis of SARS-CoV-2 by FDA under an Emergency Use Authorization (EUA). This EUA will remain  in effect (meaning this test can be used) for the duration of the COVID-19 declaration under Section 564(b)(1)  of the Act, 21 U.S.C.section 360bbb-3(b)(1), unless the authorization is terminated  or revoked sooner.       Influenza A by PCR NEGATIVE NEGATIVE Final   Influenza B by PCR NEGATIVE NEGATIVE Final    Comment: (NOTE) The Xpert Xpress SARS-CoV-2/FLU/RSV plus assay is intended as an aid in the diagnosis of influenza from Nasopharyngeal swab specimens and should not be used as a sole basis for treatment. Nasal washings and aspirates are unacceptable for Xpert Xpress SARS-CoV-2/FLU/RSV testing.  Fact Sheet for Patients: EntrepreneurPulse.com.au  Fact Sheet for Healthcare Providers: IncredibleEmployment.be  This test is not yet approved or cleared by the [place] FDA and has been authorized for detection and/or diagnosis of SARS-CoV-2 by FDA under an Emergency Use Authorization (EUA). This EUA will remain in effect (meaning this test can be used) for the duration of the COVID-19 declaration under Section 564(b)(1) of the Act, 21 U.S.C. section 360bbb-3(b)(1), unless the authorization is terminated or revoked.  Performed at [facility], [address] [place], [place] [address]   Urine culture     Status: Abnormal   Collection Time: 01/07/21 10:19 AM   Specimen: Urine, Random  Result Value Ref Range Status   Specimen Description   Final    URINE, RANDOM Performed at [facility], [address] [place], [place] [address]    Special Requests   Final    NONE Performed at [facility], [address] [place], [place] [address]    Culture (A)  Final    <10,000 COLONIES/mL INSIGNIFICANT GROWTH Performed at [facility], [place] [address], [place], [place] [address]    Report Status 01/08/2021 FINAL  Final     Studies: No results found.    Flora Lipps, MD  [facility] 01/11/2021  If 7PM-7AM, please contact night-coverage

## 2021-01-11 NOTE — Care Management Important Message (Signed)
Important Message  Patient Details IM Letter given to the Patient. Name: Jamie Potter MRN: [id number] Date of Birth: 1930-09-29   Medicare Important Message Given:  Yes     Kerin Salen 01/11/2021, 11:12 AM

## 2021-01-11 NOTE — Evaluation (Signed)
Clinical/Bedside Swallow Evaluation Patient Details  Name: Jamie Potter MRN: [id number] Date of Birth: [birth year]  Today's Date: 01/11/2021 Time: SLP Start Time (ACUTE ONLY): 0930 SLP Stop Time (ACUTE ONLY): 0955 SLP Time Calculation (min) (ACUTE ONLY): 25 min  Past Medical History:  Past Medical History:  Diagnosis Date  . Hypertension    Past Surgical History: History reviewed. No pertinent surgical history. HPI:  Jamie Potter is a 87 y.o. male with medical history significant of hypertension, hyperlipidemia presents to the emergency department 01/07/21  for evaluation of weakness after his sister went to check on him and he was on the ground at home.  Acute symptomatic electrolyte abnormalities, metabolic encephalopathy, incidental finding of prostate mass. Lives alone with frequent checks by family, ambulates with cane. CXR revealed Mild pulmonary vascular congestion and small bilateral pleural  effusions. RN working with patient evening of 3/27 observed him to be coughing while drinking Ensure shake and BSE was ordered.   Assessment / Plan / Recommendation Clinical Impression  Patient presents with what appears to be a very mild oral dysphagia with likely impact from current cognitive status(confusion) leading to mild oral residuals with solids but no overt aspiration, penetration or difficulty observed with thin liquids or puree solids. Swallow initiation appeared timely and no change in vocal quality observed. Patient appears to be tolerating regular solids, thin liquids diet and SLP is not recommending formal treatment. Coughing incident that was observed on 3/27 in late evening by nursing likely was an isolated incident, but please reconsult if any future concerns arise regarding patient's swallow function. SLP Visit Diagnosis: Dysphagia, unspecified (R13.10)    Aspiration Risk  No limitations    Diet Recommendation Regular;Thin liquid   Liquid Administration via:  Cup;Straw Medication Administration: Whole meds with liquid Supervision: Patient able to self feed;Full supervision/cueing for compensatory strategies Compensations: Slow rate;Small sips/bites Postural Changes: Seated upright at 90 degrees    Other  Recommendations Oral Care Recommendations: Oral care BID;Staff/trained caregiver to provide oral care   Follow up Recommendations None      Frequency and Duration   N/A         Prognosis    N/A    Swallow Study   General Date of Onset: 01/10/21 HPI: Jamie Potter is a 87 y.o. male with medical history significant of hypertension, hyperlipidemia presents to the emergency department 01/07/21  for evaluation of weakness after his sister went to check on him and he was on the ground at home.  Acute symptomatic electrolyte abnormalities, metabolic encephalopathy, incidental finding of prostate mass. Lives alone with frequent checks by family, ambulates with cane. CXR revealed Mild pulmonary vascular congestion and small bilateral pleural  effusions. RN working with patient evening of 3/27 observed him to be coughing while drinking Ensure shake and BSE was ordered. Type of Study: Bedside Swallow Evaluation Previous Swallow Assessment: None found Diet Prior to this Study: Regular;Thin liquids Temperature Spikes Noted: No Respiratory Status: Room air History of Recent Intubation: No Behavior/Cognition: Alert;Cooperative;Confused;Pleasant mood Oral Cavity Assessment: Within Functional Limits Oral Care Completed by SLP: No Oral Cavity - Dentition: Dentures, top;Dentures, bottom Vision: Functional for self-feeding Self-Feeding Abilities: Able to feed self;Needs set up;Needs assist Patient Positioning: Upright in bed Baseline Vocal Quality: Normal Volitional Cough: Cognitively unable to elicit Volitional Swallow: Unable to elicit    Oral/Motor/Sensory Function Overall Oral Motor/Sensory Function: Within functional limits   Ice Chips     Thin  Liquid Thin Liquid: Within functional limits Presentation: Straw  Nectar Thick     Honey Thick     Puree Puree: Within functional limits   Solid     Solid: Impaired Oral Phase Impairments: Impaired mastication Oral Phase Functional Implications: Oral residue      Sonia Baller, MA, CCC-SLP Speech Therapy

## 2021-01-12 DIAGNOSIS — N4289 Other specified disorders of prostate: Secondary | ICD-10-CM | POA: Diagnosis not present

## 2021-01-12 DIAGNOSIS — E876 Hypokalemia: Secondary | ICD-10-CM | POA: Diagnosis not present

## 2021-01-12 DIAGNOSIS — R627 Adult failure to thrive: Secondary | ICD-10-CM | POA: Diagnosis not present

## 2021-01-12 DIAGNOSIS — R531 Weakness: Secondary | ICD-10-CM | POA: Diagnosis not present

## 2021-01-12 LAB — COMPREHENSIVE METABOLIC PANEL
ALT: 8 U/L (ref 0–44)
AST: 20 U/L (ref 15–41)
Albumin: 2.7 g/dL — ABNORMAL LOW (ref 3.5–5.0)
Alkaline Phosphatase: 236 U/L — ABNORMAL HIGH (ref 38–126)
Anion gap: 7 (ref 5–15)
BUN: 12 mg/dL (ref 8–23)
CO2: 28 mmol/L (ref 22–32)
Calcium: 8 mg/dL — ABNORMAL LOW (ref 8.9–10.3)
Chloride: 102 mmol/L (ref 98–111)
Creatinine, Ser: 0.56 mg/dL — ABNORMAL LOW (ref 0.61–1.24)
GFR, Estimated: >60 mL/min (ref >60)
Glucose, Bld: 98 mg/dL (ref 70–99)
Potassium: 3.8 mmol/L (ref 3.5–5.1)
Sodium: 137 mmol/L (ref 135–145)
Total Bilirubin: 1.2 mg/dL (ref 0.3–1.2)
Total Protein: 6.1 g/dL — ABNORMAL LOW (ref 6.5–8.1)

## 2021-01-12 LAB — CBC
HCT: 24.2 % — ABNORMAL LOW (ref 39.0–52.0)
Hemoglobin: 7.5 g/dL — ABNORMAL LOW (ref 13.0–17.0)
MCH: 27.8 pg (ref 26.0–34.0)
MCHC: 31 g/dL (ref 30.0–36.0)
MCV: 89.6 fL (ref 80.0–100.0)
Platelets: 255 10*3/uL (ref 150–400)
RBC: 2.7 MIL/uL — ABNORMAL LOW (ref 4.22–5.81)
RDW: 17.3 % — ABNORMAL HIGH (ref 11.5–15.5)
WBC: 4.9 10*3/uL (ref 4.0–10.5)
nRBC: 0 % (ref 0.0–0.2)

## 2021-01-12 NOTE — Progress Notes (Addendum)
PROGRESS NOTE  Jamie Potter GEX:[id number] DOB: December 04, 1929 DOA: 01/07/2021 PCP: Iva Lento, PA-C   LOS: 5 days   Brief narrative:  Jamie Potter is a 87 y.o. male with medical history significant of hypertension, hyperlipidemia presented to hospital with generalized weakness, difficulty ambulating, poor oral intake, failure to thrive with significant weight loss. In the ED, labs showed hypokalemia, hypocalcemia, hypoalbuminemia, elevated alk phos and minimally elevated T bili.  UA appears somewhat dark with rare bacteria.  Patient also notably anemic at 7.5.  Patient received IV fluids, chest x-ray CT head and abdomen pelvis  supplemental potassium 50 mEq in total.  Given profound weakness, ambulatory dysfunction, poor oral intake, failure to thrive and concerning history per family patient was transferred to [facility] long hospital for further evaluation and treatment.  After hospitalization, patient was seen by physical therapy who recommended skilled nursing facility placement.  CT scan of the abdomen and pelvis showed multiple skeletal lesions suggestive of metastatic disease.  Family was made aware of this findings but wanted their family doctor and oncologist at [facility] system and they are working on setting up an appointment..  Family wishing skilled nursing facility placement at this time.  Assessment/Plan:  Principal Problem:   Weakness generalized Active Problems:   General weakness   Failure to thrive in adult   Prostate mass   Hypokalemia  Ambulatory dysfunction, profound weakness and debility, recent fall. Patient does have prostate mass with multiple skeletal lesions likely metastatic disease causing generalized weakness and failure to thrive.  Patient with poor oral intake.  TSH within normal limits.  Physical therapy recommended skilled nursing facility placement.  Transition of care on board.  Bradycardia.  Mild.  Will avoid beta-blockers.  Hemodynamically  stable.  EKG showed QT prolongation.  Avoid QT prolonging medications as much as possible.  Patient has been started on Remeron due to agitation insomnia at nighttime at the request of the family several times.  Mild confusion, disorientation.  Likely hospital acquired delirium..  Currently on Remeron due to lack of sleep and agitation confusion.  Better today.  Patient sitting on the chair.  Patient's son at bedside  Left hand pain and swelling.  Off restraints at this time.  Check x-ray of the hand without any evidence of fracture.  Apply cool compresses if needed  Failure to to thrive.  Likely secondary to malignancy.  Seen by dietary.  Currently on Ensure.  Encourage oral nutrition.  Discontinue IV fluids.  Patient has been advanced on regular diet.  Anemia.    Likely anemia of chronic disease/malignancy.  Continue to monitor closely.  No need for transfusion so far.  Latest hemoglobin of 8.3..   Prostate mass with multiple osteoblastic lesions in the bony skeleton.  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  Family wishes to follow-up with [facility].  Offered oncology consultation here but family is working on getting him appointment with [facility] providers in with him to go to skilled nursing facility and get stronger before considering treatment.  At this advanced age patient is unlikely to be aggressive candidate for treatment.  Hypokalemia, improved after replacement.  Mild hypophosphatemia.  Improved with replacement.  Hypertension  Continue amlodipine and lisinopril.  Will adjust medications as necessary.  Hyperlipidemia Continue low-dose pravastatin.  DVT prophylaxis: enoxaparin (LOVENOX) injection 40 mg Start: 01/07/21 1800   Code Status: Full code  Family Communication:  Spoke with the patient's son at bedside.  Status is: Inpatient  Remains  inpatient appropriate because:Unsafe d/c plan, IV treatments appropriate due to intensity of illness or  inability to take PO, Inpatient level of care appropriate due to severity of illness and need for rehabilitation.  Dispo: The patient is from: Home              Anticipated d/c is to: SNF as per physical therapy recommendation.              Patient currently is medically stable to d/c.   Difficult to place patient No   Consultants:  None  Procedures:  None  Anti-infectives:  . None  Anti-infectives (From admission, onward)   None     Subjective: Today, patient was seen and examined at bedside.  Sitting up at bedside chair.  Son at bedside.  Patient denies any nausea vomiting fever chills or overt pain.    Objective: Vitals:   01/11/21 2120 01/12/21 0615  BP: (!) 155/62 (!) 141/49  Pulse: (!) 52 (!) 49  Resp: 18 16  Temp: (!) 97.5 F (36.4 C) 98 F (36.7 C)  SpO2: 97% 96%    Intake/Output Summary (Last 24 hours) at 01/12/2021 1338 Last data filed at 01/12/2021 0600 Gross per 24 hour  Intake 240 ml  Output 1500 ml  Net -1260 ml   Filed Weights   01/07/21 0005  Weight: 59 kg   Body mass index is 19.2 kg/m.   Physical Exam: General: Thinly built, not in obvious distress, communicative, disoriented elderly male HENT:   No scleral pallor or icterus noted. Oral mucosa is moist.  Chest:   Diminished breath sounds bilaterally. No crackles or wheezes.  CVS: S1 &S2 heard. No murmur.  Regular rate and rhythm. Abdomen: Soft, nontender, nondistended.  Bowel sounds are heard.   Extremities: No cyanosis, clubbing or edema.  Peripheral pulses are palpable. Psych: Alert, awake and communicative, disoriented normal mood CNS:  No cranial nerve deficits.  Moves all extremities Skin: Warm and dry.  No rashes noted.   Data Review: I have personally reviewed the following laboratory data and studies,  CBC: Recent Labs  Lab 01/07/21 0053 01/08/21 0402 01/09/21 0448 01/10/21 0409 01/11/21 0351 01/12/21 0921  WBC 5.6 4.6 5.0 4.9 5.4 4.9  NEUTROABS 3.6  --   --   --    --   --   HGB 7.5* 7.7* 7.8* 8.3* 7.5* 7.5*  HCT 22.5* 24.5* 24.3* 25.6* 23.3* 24.2*  MCV 87.9 91.8 89.0 89.8 87.9 89.6  PLT 223 192 214 232 244 993   Basic Metabolic Panel: Recent Labs  Lab 01/07/21 0053 01/08/21 0402 01/09/21 0448 01/10/21 0409 01/11/21 0351 01/12/21 0921  NA 135 135 133* 134* 135 137  K 3.0* 3.3* 3.6 4.4 3.9 3.8  CL 99 104 101 100 101 102  CO2 _0 GLUCOSE 100* 97 113* 112* 97 98  BUN _1 CREATININE 0.73 0.62 0.54* 0.78 0.54* 0.56*  CALCIUM 8.0* 8.0* 8.1* 8.2* 7.7* 8.0*  MG 1.7  --  1.7 1.8 1.9  --   PHOS  --   --  2.0* 2.7 2.9  --    Liver Function Tests: Recent Labs  Lab 01/08/21 0402 01/09/21 0448 01/10/21 0409 01/11/21 0351 01/12/21 0921  AST _2 13* 20  ALT _3 ALKPHOS 264* 240* 245* 233* 236*  BILITOT 1.9* 1.6* 0.9 1.0 1.2  PROT 6.2* 6.3* 6.3* 5.7* 6.1*  ALBUMIN 2.9*  2.7* 2.7* 2.4* 2.7*   No results for input(s): LIPASE, AMYLASE in the last 168 hours. No results for input(s): AMMONIA in the last 168 hours. Cardiac Enzymes: Recent Labs  Lab 01/07/21 0053  CKTOTAL 307   BNP (last 3 results) Recent Labs    01/07/21 0053  BNP 258.6*    ProBNP (last 3 results) No results for input(s): PROBNP in the last 8760 hours.  CBG: No results for input(s): GLUCAP in the last 168 hours. Recent Results (from the past 240 hour(s))  Resp Panel by RT-PCR (Flu A&B, Covid) Nasopharyngeal Swab     Status: None   Collection Time: 01/07/21 12:53 AM   Specimen: Nasopharyngeal Swab; Nasopharyngeal(NP) swabs in vial transport medium  Result Value Ref Range Status   SARS Coronavirus 2 by RT PCR NEGATIVE NEGATIVE Final    Comment: (NOTE) SARS-CoV-2 target nucleic acids are NOT DETECTED.  The SARS-CoV-2 RNA is generally detectable in upper respiratory specimens during the acute phase of infection. The lowest concentration of SARS-CoV-2 viral copies this assay can detect is 138 copies/mL. A negative result does  not preclude SARS-Cov-2 infection and should not be used as the sole basis for treatment or other patient management decisions. A negative result may occur with  improper specimen collection/handling, submission of specimen other than nasopharyngeal swab, presence of viral mutation(s) within the areas targeted by this assay, and inadequate number of viral copies(<138 copies/mL). A negative result must be combined with clinical observations, patient history, and epidemiological information. The expected result is Negative.  Fact Sheet for Patients:  EntrepreneurPulse.com.au  Fact Sheet for Healthcare Providers:  IncredibleEmployment.be  This test is no t yet approved or cleared by the [place] FDA and  has been authorized for detection and/or diagnosis of SARS-CoV-2 by FDA under an Emergency Use Authorization (EUA). This EUA will remain  in effect (meaning this test can be used) for the duration of the COVID-19 declaration under Section 564(b)(1) of the Act, 21 U.S.C.section 360bbb-3(b)(1), unless the authorization is terminated  or revoked sooner.       Influenza A by PCR NEGATIVE NEGATIVE Final   Influenza B by PCR NEGATIVE NEGATIVE Final    Comment: (NOTE) The Xpert Xpress SARS-CoV-2/FLU/RSV plus assay is intended as an aid in the diagnosis of influenza from Nasopharyngeal swab specimens and should not be used as a sole basis for treatment. Nasal washings and aspirates are unacceptable for Xpert Xpress SARS-CoV-2/FLU/RSV testing.  Fact Sheet for Patients: EntrepreneurPulse.com.au  Fact Sheet for Healthcare Providers: IncredibleEmployment.be  This test is not yet approved or cleared by the [place] FDA and has been authorized for detection and/or diagnosis of SARS-CoV-2 by FDA under an Emergency Use Authorization (EUA). This EUA will remain in effect (meaning this test can be used) for the  duration of the COVID-19 declaration under Section 564(b)(1) of the Act, 21 U.S.C. section 360bbb-3(b)(1), unless the authorization is terminated or revoked.  Performed at [facility], [address], [place], [place] [address]   Urine culture     Status: Abnormal   Collection Time: 01/07/21 10:19 AM   Specimen: Urine, Random  Result Value Ref Range Status   Specimen Description   Final    URINE, RANDOM Performed at [facility], [address] [place], [place] [address]    Special Requests   Final    NONE Performed at [facility], [address] [place], [place] [address]    Culture (  A)  Final    <10,000 COLONIES/mL INSIGNIFICANT GROWTH Performed at [facility] [address], [place], [place] [address]    Report Status 01/08/2021 FINAL  Final     Studies: DG Hand 2 View Left  Result Date: 01/11/2021 CLINICAL DATA:  Left hand swelling.  No known injury.  Confusion. EXAM: LEFT HAND - 2 VIEW COMPARISON:  None. FINDINGS: PA and oblique lateral views are submitted. The mineralization and alignment are normal. There is no evidence of acute fracture or dislocation. There are scattered degenerative changes, most advanced at the 1st carpometacarpal and 3rd distal interphalangeal joints. Chondrocalcinosis of the triangular fibrocartilage noted. Possible mild diffuse soft tissue swelling without soft tissue emphysema or foreign body. IMPRESSION: Degenerative changes and possible mild soft tissue swelling. No acute osseous findings or evidence of foreign body. Electronically Signed   By: Richardean Sale M.D.   On: 01/11/2021 12:13      Flora Lipps, MD  [facility] 01/12/2021  If 7PM-7AM, please contact night-coverage

## 2021-01-12 NOTE — TOC Progression Note (Addendum)
Transition of Care [facility]) - Progression Note    Patient Details  Name: Jamie Potter MRN: [id number] Date of Birth: Dec 10, 1929  [facility] CM/SW Contact  Purcell Mouton, RN Phone Number: 01/12/2021, 4:36 PM  Clinical Narrative:    Pt have two bed offers, however, need 48 hours without restraints. Insurance authorization was started.    Expected Discharge Plan: [place] Barriers to Discharge: No Barriers Identified  Expected Discharge Plan and Services Expected Discharge Plan: [place] arrangements for the past 2 months: Single Family Home                                       Social Determinants of Health (SDOH) Interventions    Readmission Risk Interventions No flowsheet data found.

## 2021-01-13 LAB — BASIC METABOLIC PANEL
Anion gap: 10 (ref 5–15)
BUN: 14 mg/dL (ref 8–23)
CO2: 26 mmol/L (ref 22–32)
Calcium: 8.4 mg/dL — ABNORMAL LOW (ref 8.9–10.3)
Chloride: 104 mmol/L (ref 98–111)
Creatinine, Ser: 0.8 mg/dL (ref 0.61–1.24)
GFR, Estimated: >60 mL/min (ref >60)
Glucose, Bld: 138 mg/dL — ABNORMAL HIGH (ref 70–99)
Potassium: 3.7 mmol/L (ref 3.5–5.1)
Sodium: 140 mmol/L (ref 135–145)

## 2021-01-13 LAB — CBC
HCT: 24.1 % — ABNORMAL LOW (ref 39.0–52.0)
Hemoglobin: 7.4 g/dL — ABNORMAL LOW (ref 13.0–17.0)
MCH: 28.6 pg (ref 26.0–34.0)
MCHC: 30.7 g/dL (ref 30.0–36.0)
MCV: 93.1 fL (ref 80.0–100.0)
Platelets: 253 10*3/uL (ref 150–400)
RBC: 2.59 MIL/uL — ABNORMAL LOW (ref 4.22–5.81)
RDW: 17.5 % — ABNORMAL HIGH (ref 11.5–15.5)
WBC: 4.9 10*3/uL (ref 4.0–10.5)
nRBC: 0 % (ref 0.0–0.2)

## 2021-01-13 LAB — PHOSPHORUS: Phosphorus: 2.6 mg/dL (ref 2.5–4.6)

## 2021-01-13 LAB — MAGNESIUM: Magnesium: 2 mg/dL (ref 1.7–2.4)

## 2021-01-13 NOTE — Progress Notes (Signed)
Occupational Therapy Treatment Patient Details Name: Jamie Potter MRN: [id number] DOB: 1930-03-31 Today's Date: 01/13/2021    History of present illness QUINTARIUS FERNS is a 87 y.o. male with medical history significant of hypertension, hyperlipidemia presents to the emergency department 01/07/21  for evaluation of weakness,  Acute symptomatic electrolyte abnormalities, metabolic encephalopathy, incidental finding of prostate mass. Lives alone with frequent checks by family, ambulates with cane.   OT comments  Patient up in chair with son in room when therapist entered room. He is pleasant, able to follow commands and needs verbal cues for walker management. Patient able to walk to bathroom with with min guard from therapist and stand at sink to perform tasks with supervision - verbal cues to perform tasks with better quality and stay on task. No overt loss of balance. Patient returned to seated position in recliner. Patient's son reports plan is for family to take patient home. Therapist recommended use BSC and shower chair for safety. Patient's son verbalized understanding.   Follow Up Recommendations  Home health OT;Supervision/Assistance - 24 hour;SNF    Equipment Recommendations  3 in 1 bedside commode;Tub/shower seat    Recommendations for Other Services      Precautions / Restrictions Precautions Precautions: Fall Restrictions Weight Bearing Restrictions: No       Mobility Bed Mobility               General bed mobility comments: Up in chiar    Transfers Overall transfer level: Needs assistance Equipment used: Rolling walker (2 wheeled) Transfers: Sit to/from Stand Sit to Stand: Min guard         General transfer comment: Min guard to ambulate to bathroom, perform standing groomign tasks and return to recliner. Verbal  cues for walker management.    Balance Overall balance assessment: Needs assistance Sitting-balance support: No upper extremity supported;Feet  supported Sitting balance-Leahy Scale: Good     Standing balance support: During functional activity;Bilateral upper extremity supported Standing balance-Leahy Scale: Fair Standing balance comment: reliant on walker but not on physical assistance from therapist                           ADL either performed or assessed with clinical judgement   ADL       Grooming: Standing;Oral care;Cueing for sequencing;Wash/dry hands;Wash/dry face Grooming Details (indicate cue type and reason): min guard to stand at sink to perform grooming tasks                                     Vision   Vision Assessment?: No apparent visual deficits   Perception     Praxis      Cognition Arousal/Alertness: Awake/alert Behavior During Therapy: WFL for tasks assessed/performed Overall Cognitive Status: Impaired/Different from baseline                                 General Comments: Patient pleasant and alert to self. Able to follow commands. Knows he is in the hospital - otherwise not oriented to situation.        Exercises     Shoulder Instructions       General Comments      Pertinent Vitals/ Pain       Pain Assessment: No/denies pain  Home Living  Prior Functioning/Environment              Frequency  Min 2X/week        Progress Toward Goals  OT Goals(current goals can now be found in the care plan section)  Progress towards OT goals: Progressing toward goals  Acute Rehab OT Goals Patient Stated Goal: go home with some help Time For Goal Achievement: 01/22/21 Potential to Achieve Goals: Good  Plan Discharge plan needs to be updated    Co-evaluation          OT goals addressed during session: ADL's and self-care      AM-PAC OT "6 Clicks" Daily Activity     Outcome Measure   Help from another person eating meals?: A Little Help from another person taking  care of personal grooming?: A Little Help from another person toileting, which includes using toliet, bedpan, or urinal?: A Little Help from another person bathing (including washing, rinsing, drying)?: A Little Help from another person to put on and taking off regular upper body clothing?: A Little Help from another person to put on and taking off regular lower body clothing?: A Little 6 Click Score: 18    End of Session Equipment Utilized During Treatment: Gait belt;Rolling walker  OT Visit Diagnosis: Unsteadiness on feet (R26.81);Other symptoms and signs involving cognitive function   Activity Tolerance Patient tolerated treatment well   Patient Left in chair;with call bell/phone within reach;with chair alarm set;with family/visitor present   Nurse Communication Mobility status        Time: 1735-6701 OT Time Calculation (min): 13 min  Charges: OT General Charges $OT Visit: 1 Visit OT Treatments $Self Care/Home Management : 8-22 mins  Derl Barrow, OTR/L [place]  Office [phone] Pager: [place] 01/13/2021, 10:46 AM

## 2021-01-13 NOTE — Discharge Summary (Signed)
Physician Discharge Summary  Jamie Potter EHO:[id number] DOB: July 07, 1930 DOA: 01/07/2021  PCP: Iva Lento, PA-C  Admit date: 01/07/2021 Discharge date: 01/13/2021  Admitted From: Home Disposition: Home   Recommendations for Outpatient Follow-up:  1. Follow up with PCP as soon as possible and initiate work up for suspected prostate cancer vs. comfort care only after goals of care discussion. 2. See below for further recommendations  Home Health: PT, NA, CSW as SNF was recommended Equipment/Devices: None new Discharge Condition: Stable CODE STATUS: Full Diet recommendation: Heart healthy  Brief/Interim Summary: Jamie Potter an 87 y.o.malewith medical history significant ofhypertension, hyperlipidemia, and undiagnosed dementia who presented to hospital with generalized weakness, difficulty ambulating, poor oral intake, failure to thrive with significant weight loss.In the ED, labs showed hypokalemia, hypocalcemia, hypoalbuminemia, elevated alk phos and minimally elevated T bili. UA appears somewhat dark with rare bacteria. Patient also notably anemic at 7.5. Patient received IV fluids, chest x-ray CT head and abdomen pelvis  supplemental potassium 50 mEq in total. Given profound weakness, ambulatory dysfunction, poor oral intake, failure to thrive and concerning history per family patient was transferred to [facility] long hospital for further evaluation and treatment.  CT scan of the abdomen and pelvis showed multiple skeletal lesions suggestive of metastatic disease. Family was made aware of this findings but wanted to follow up with their family doctor and oncologist at [facility] system to discuss further work up. SNF rehabilitation was recommended, though the family ultimately has opted to take the patient home with maximal home health services.   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  Family wishes to follow-up with [facility] health system.  Offered oncology consultation here but family is working on getting him appointment with [facility] providers. At this advanced age and debility, the patient is unlikely to be a candidate for aggressive treatment.  Ambulatory dysfunction, profound weakness and debility, recent fall. Patient does have prostate mass with multiple skeletal lesions likely metastatic disease causing generalized weakness and failure to thrive.  Patient with poor oral intake.   - Would strongly encourage palliative care involvement going forward.   Bradycardia: Asymptomatic without hypotension. Unlikely to have any real benefit from pacemaker insertion. Avoid nodal agents.   Disorientation.  Likely hospital acquired delirium given waxing waning status though son reports progressive cognitive decline suggestive of dementia though this has not been formally evaluated.   Left hand pain and swelling. X-ray of the hand without any evidence of fracture.  Apply cool compresses if needed  Failure to to thrive.  Likely secondary to malignancy. Supplement protein as able.  Anemia.    Likely anemia of chronic disease/malignancy.  - Check at follow up. No bleeding  Hypokalemia, improved after replacement.  Mild hypophosphatemia.  Improved with replacement.  Hypertension Continue amlodipine and lisinopril.   Hyperlipidemia Continue low-dose pravastatin.  Discharge Instructions  Allergies as of 01/13/2021   No Known Allergies     Medication List    TAKE these medications   amLODipine 10 MG tablet Commonly known as: NORVASC Take 10 mg by mouth daily.   aspirin EC 81 MG tablet Take 81 mg by mouth daily. Swallow whole.   furosemide 20 MG tablet Commonly known as: LASIX Take 20 mg by mouth  daily.  lisinopril 40 MG tablet Commonly known as: ZESTRIL Take 40 mg by mouth daily.   lovastatin 40 MG tablet Commonly known as: MEVACOR Take 40 mg by mouth daily.       Follow-up Information    Iva Lento, PA-C. Schedule an appointment as soon as possible for a visit.   Specialty: Internal Medicine Contact information: [place] [address] [address], [facility] Follow up.   Specialty: [place] Why: Please call Alvis Lemmings if you have any questions or concerns about home health.  Contact information: [address] [place] [address] [phone]        [facility], [facility] Follow up.   Why: Rolling Walker and Bedside [employer] from [employer], if you have any questions please call [phone].  Contact information: [place] [address] [phone]              No Known Allergies  Consultations:  None  Procedures/Studies: CT Head Wo Contrast  Result Date: 01/07/2021 CLINICAL DATA:  Mental status change EXAM: CT HEAD WITHOUT CONTRAST TECHNIQUE: Contiguous axial images were obtained from the base of the skull through the vertex without intravenous contrast. COMPARISON:  None. FINDINGS: Brain: No evidence of acute territorial infarction, hemorrhage, hydrocephalus,extra-axial collection or mass lesion/mass effect. There is dilatation the ventricles and sulci consistent with age-related atrophy. Low-attenuation changes in the deep white matter consistent with small vessel ischemia. Vascular: No hyperdense vessel or unexpected calcification. Skull: The skull is intact. No fracture or focal lesion identified. Sinuses/Orbits: The visualized paranasal sinuses and mastoid air cells are clear. The orbits and globes intact. Other: None IMPRESSION: No acute intracranial abnormality. Findings consistent with age related atrophy and chronic small vessel ischemia Electronically Signed   By:  Prudencio Pair M.D.   On: 01/07/2021 02:00   CT ABDOMEN PELVIS W CONTRAST  Result Date: 01/07/2021 CLINICAL DATA:  87 year old male with right lower quadrant abdominal pain. Weight loss. EXAM: CT ABDOMEN AND PELVIS WITH CONTRAST TECHNIQUE: Multidetector CT imaging of the abdomen and pelvis was performed using the standard protocol following bolus administration of intravenous contrast. CONTRAST:  128m OMNIPAQUE IOHEXOL 300 MG/ML  SOLN COMPARISON:  None. FINDINGS: Evaluation is limited due to streak artifact caused by patient's arms. Lower chest: Partially visualized moderate bilateral pleural effusions with partial compressive atelectasis of the lower lobes. Pneumonia is not excluded clinical correlation is recommended. There is advanced multi vessel coronary vascular calcification and partially visualized pericardial effusion measuring 1 cm in thickness. No intra-abdominal free air. Mild diffuse mesenteric edema. No free fluid. Hepatobiliary: Multiple small hepatic hypodense lesions not characterized on this CT but the larger lesion measuring 16 mm demonstrates fluid attenuation consistent with a cyst. Scattered subcentimeter lesions are too small to characterize. There is morphologic changes of cirrhosis with mild biliary ductal dilatation versus mild periportal edema. The gallbladder is unremarkable. Pancreas: Unremarkable. No pancreatic ductal dilatation or surrounding inflammatory changes. Spleen: Normal in size without focal abnormality. Adrenals/Urinary Tract: The adrenal glands unremarkable. There is no hydronephrosis on either side. There is symmetric enhancement and excretion of contrast by both kidneys. The visualized ureters appear unremarkable. There is diffuse trabeculated appearance of the bladder wall consistent with chronic bladder outlet obstruction. Correlation with urinalysis recommended to exclude cystitis. Stomach/Bowel: There is moderate stool throughout the colon. There is diffuse colonic  diverticulosis without active inflammatory changes. There is no bowel obstruction. The appendix is normal. Vascular/Lymphatic: Advanced  aortoiliac atherosclerotic disease. The IVC is unremarkable. No portal venous gas. There is a 3.5 x 3.3 cm rounded mass with apparent central hypoenhancement to the left of the distal abdominal aorta (45/2) most likely a centrally necrotic enlarged lymph node. There is a 4.3 x 4.4 cm rounded solid mass along the right pelvic side wall likely an enlarged lymph node versus a malignancy such as sarcoma. Reproductive: Enlarged and heterogeneous prostate gland with areas of nodularity most consistent with prostate cancer. Correlation with clinical exam and PSA levels recommended. Other: Diffuse subcutaneous edema and anasarca. No fluid collection. Musculoskeletal: Diffuse sclerotic metastatic disease. No acute osseous pathology. IMPRESSION: 1. Findings most consistent with prostate cancer with extensive osseous metastasis. Correlation with clinical exam and PSA levels recommended. 2. Rounded soft tissue masses to the left of the distal abdominal aorta and along the right iliac chain most consistent with metastatic adenopathy. 3. Diffuse colonic diverticulosis. No bowel obstruction. Normal appendix. 4. Cirrhosis. 5. Partially visualized moderate bilateral pleural effusions with partial compressive atelectasis of the lower lobes. 6. Aortic Atherosclerosis (ICD10-I70.0). Electronically Signed   By: Anner Crete M.D.   On: 01/07/2021 02:10   DG Hand 2 View Left  Result Date: 01/11/2021 CLINICAL DATA:  Left hand swelling.  No known injury.  Confusion. EXAM: LEFT HAND - 2 VIEW COMPARISON:  None. FINDINGS: PA and oblique lateral views are submitted. The mineralization and alignment are normal. There is no evidence of acute fracture or dislocation. There are scattered degenerative changes, most advanced at the 1st carpometacarpal and 3rd distal interphalangeal joints. Chondrocalcinosis  of the triangular fibrocartilage noted. Possible mild diffuse soft tissue swelling without soft tissue emphysema or foreign body. IMPRESSION: Degenerative changes and possible mild soft tissue swelling. No acute osseous findings or evidence of foreign body. Electronically Signed   By: Richardean Sale M.D.   On: 01/11/2021 12:13   DG Chest Portable 1 View  Result Date: 01/07/2021 CLINICAL DATA:  Weakness EXAM: PORTABLE CHEST 1 VIEW COMPARISON:  None. FINDINGS: The heart size and mediastinal contours are mildly enlarged with aortic knob calcifications are seen. There is prominence of the central pulmonary vasculature. Small bilateral pleural effusions are seen, left greater than right. The visualized skeletal structures are unremarkable. IMPRESSION: Mild pulmonary vascular congestion and small bilateral pleural effusions. Electronically Signed   By: Prudencio Pair M.D.   On: 01/07/2021 01:01    Subjective: Confused. Son at bedside says he has arranged to take care of the patient at his home. No overnight issues.   Discharge Exam: Vitals:   01/12/21 2053 01/13/21 0516  BP: (!) 142/51 (!) 153/57  Pulse: (!) 43 (!) 46  Resp: 20 16  Temp: 97.7 F (36.5 C) 98.7 F (37.1 C)  SpO2: 96% 96%   General: Pt is alert, awake, not in acute distress Cardiovascular: Regular bradycardia, S1/S2 +, no rubs, no gallops Respiratory: CTA bilaterally, no wheezing, no rhonchi Abdominal: Soft, NT, ND, bowel sounds + Extremities: No edema, no cyanosis  Labs: BNP (last 3 results) Recent Labs    01/07/21 0053  BNP 656.8*   Basic Metabolic Panel: Recent Labs  Lab 01/09/21 0448 01/10/21 0409 01/11/21 0351 01/12/21 0921 01/13/21 0455  NA 133* 134* 135 137 140  K 3.6 4.4 3.9 3.8 3.7  CL 101 100 101 102 104  CO2 23 25 26 28 26   GLUCOSE 113* 112* 97 98 138*  BUN 12 12 13 12 14   CREATININE 0.54* 0.78 0.54* 0.56* 0.80  CALCIUM 8.1* 8.2* 7.7*  8.0* 8.4*  MG 1.7 1.8 1.9  --  2.0  PHOS 2.0* 2.7 2.9  --  2.6    Liver Function Tests: Recent Labs  Lab 01/09/21 0448 01/10/21 0409 01/11/21 0351 01/12/21 0921  AST 16 15 13* 20  ALT 8 8 7 8   ALKPHOS 240* 245* 233* 236*  BILITOT 1.6* 0.9 1.0 1.2  PROT 6.3* 6.3* 5.7* 6.1*  ALBUMIN 2.7* 2.7* 2.4* 2.7*   No results for input(s): LIPASE, AMYLASE in the last 168 hours. No results for input(s): AMMONIA in the last 168 hours. CBC: Recent Labs  Lab 01/09/21 0448 01/10/21 0409 01/11/21 0351 01/12/21 0921 01/13/21 0455  WBC 5.0 4.9 5.4 4.9 4.9  HGB 7.8* 8.3* 7.5* 7.5* 7.4*  HCT 24.3* 25.6* 23.3* 24.2* 24.1*  MCV 89.0 89.8 87.9 89.6 93.1  PLT 214 232 244 255 253   Cardiac Enzymes: No results for input(s): CKTOTAL, CKMB, CKMBINDEX, TROPONINI in the last 168 hours. BNP: Invalid input(s): POCBNP CBG: No results for input(s): GLUCAP in the last 168 hours. D-Dimer No results for input(s): DDIMER in the last 72 hours. Hgb A1c No results for input(s): HGBA1C in the last 72 hours. Lipid Profile No results for input(s): CHOL, HDL, LDLCALC, TRIG, CHOLHDL, LDLDIRECT in the last 72 hours. Thyroid function studies No results for input(s): TSH, T4TOTAL, T3FREE, THYROIDAB in the last 72 hours.  Invalid input(s): FREET3 Anemia work up No results for input(s): VITAMINB12, FOLATE, FERRITIN, TIBC, IRON, RETICCTPCT in the last 72 hours. Urinalysis    Component Value Date/Time   COLORURINE AMBER (A) 01/07/2021 1019   APPEARANCEUR HAZY (A) 01/07/2021 1019   LABSPEC 1.015 01/07/2021 1019   PHURINE 7.0 01/07/2021 1019   GLUCOSEU NEGATIVE 01/07/2021 1019   HGBUR LARGE (A) 01/07/2021 1019   BILIRUBINUR NEGATIVE 01/07/2021 1019   KETONESUR NEGATIVE 01/07/2021 1019   PROTEINUR 30 (A) 01/07/2021 1019   NITRITE NEGATIVE 01/07/2021 1019   LEUKOCYTESUR NEGATIVE 01/07/2021 1019    Microbiology Recent Results (from the past 240 hour(s))  Resp Panel by RT-PCR (Flu A&B, Covid) Nasopharyngeal Swab     Status: None   Collection Time: 01/07/21 12:53 AM    Specimen: Nasopharyngeal Swab; Nasopharyngeal(NP) swabs in vial transport medium  Result Value Ref Range Status   SARS Coronavirus 2 by RT PCR NEGATIVE NEGATIVE Final    Comment: (NOTE) SARS-CoV-2 target nucleic acids are NOT DETECTED.  The SARS-CoV-2 RNA is generally detectable in upper respiratory specimens during the acute phase of infection. The lowest concentration of SARS-CoV-2 viral copies this assay can detect is 138 copies/mL. A negative result does not preclude SARS-Cov-2 infection and should not be used as the sole basis for treatment or other patient management decisions. A negative result may occur with  improper specimen collection/handling, submission of specimen other than nasopharyngeal swab, presence of viral mutation(s) within the areas targeted by this assay, and inadequate number of viral copies(<138 copies/mL). A negative result must be combined with clinical observations, patient history, and epidemiological information. The expected result is Negative.  Fact Sheet for Patients:  EntrepreneurPulse.com.au  Fact Sheet for Healthcare Providers:  IncredibleEmployment.be  This test is no t yet approved or cleared by the [place] FDA and  has been authorized for detection and/or diagnosis of SARS-CoV-2 by FDA under an Emergency Use Authorization (EUA). This EUA will remain  in effect (meaning this test can be used) for the duration of the COVID-19 declaration under Section 564(b)(1) of the Act, 21 U.S.C.section 360bbb-3(b)(1), unless the authorization is  terminated  or revoked sooner.       Influenza A by PCR NEGATIVE NEGATIVE Final   Influenza B by PCR NEGATIVE NEGATIVE Final    Comment: (NOTE) The Xpert Xpress SARS-CoV-2/FLU/RSV plus assay is intended as an aid in the diagnosis of influenza from Nasopharyngeal swab specimens and should not be used as a sole basis for treatment. Nasal washings and aspirates are  unacceptable for Xpert Xpress SARS-CoV-2/FLU/RSV testing.  Fact Sheet for Patients: EntrepreneurPulse.com.au  Fact Sheet for Healthcare Providers: IncredibleEmployment.be  This test is not yet approved or cleared by the [place] FDA and has been authorized for detection and/or diagnosis of SARS-CoV-2 by FDA under an Emergency Use Authorization (EUA). This EUA will remain in effect (meaning this test can be used) for the duration of the COVID-19 declaration under Section 564(b)(1) of the Act, 21 U.S.C. section 360bbb-3(b)(1), unless the authorization is terminated or revoked.  Performed at [facility], [address], [place], [place] [address]   Urine culture     Status: Abnormal   Collection Time: 01/07/21 10:19 AM   Specimen: Urine, Random  Result Value Ref Range Status   Specimen Description   Final    URINE, RANDOM Performed at [facility], [address] [place], [place] [address]    Special Requests   Final    NONE Performed at [facility], [address] [place], [place] [address]    Culture (A)  Final    <10,000 COLONIES/mL INSIGNIFICANT GROWTH Performed at [facility], [place] [address] [place], [place] [address]    Report Status 01/08/2021 FINAL  Final    Time coordinating discharge: Approximately 40 minutes  Patrecia Pour, MD  [facility] 01/15/2021, 5:03 PM

## 2021-01-13 NOTE — TOC Progression Note (Signed)
Transition of Care [facility]) - Progression Note    Patient Details  Name: Jamie Potter MRN: [id number] Date of Birth: 1929/11/13  [facility] CM/SW Contact  Purcell Mouton, RN Phone Number: 01/13/2021, 11:07 AM  Clinical Narrative:    Spoke with pt's son Jamie Potter at bedside who states family will take pt home. Text this information to pt's daughter Jamie Potter.  [facility] was selected for HHPT/HHNA/CSW. Referral given to in house rep. [facility] was called for transportation. Jamie Potter and RN are aware.   Expected Discharge Plan: [place] Barriers to Discharge: No Barriers Identified  Expected Discharge Plan and Services Expected Discharge Plan: [place] arrangements for the past 2 months: Single Family Home Expected Discharge Date: 01/13/21                                     Social Determinants of Health (SDOH) Interventions    Readmission Risk Interventions No flowsheet data found.

## 2022-10-17 DEATH — deceased

## 2023-02-08 IMAGING — CT CT HEAD W/O CM
3 series · 16 of 47 positions shown, 19 images · non-contrast
Comparison: None.

CLINICAL DATA: Mental status change

EXAM:
CT HEAD WITHOUT CONTRAST
TECHNIQUE: Contiguous axial images were obtained from the base of the skull
through the vertex without intravenous contrast.

[Series 2: head wo · axial · 0.45mm/px · z∈[-262,-112]mm · 10 of 36 slices shown, 13 images]
[im 3/36  brain]
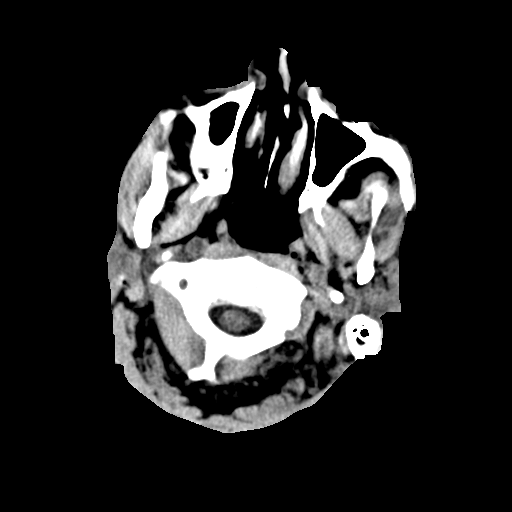
[im 3/36  bone]
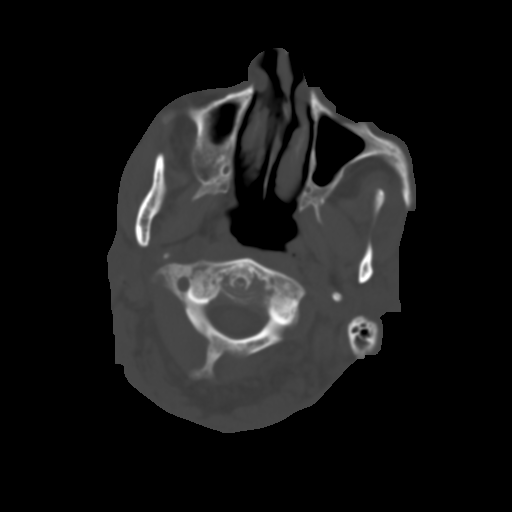
[im 7/36  brain]
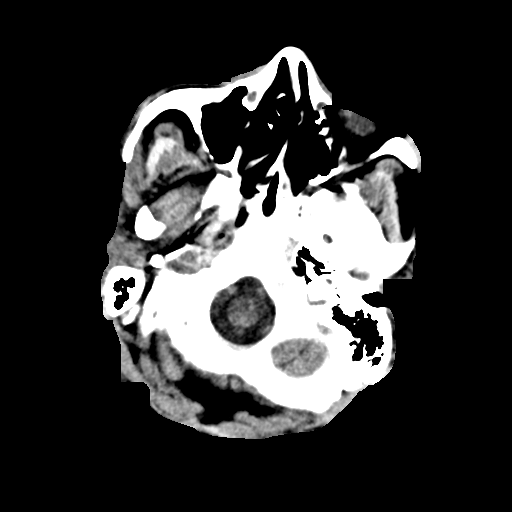
[im 10/36  brain]
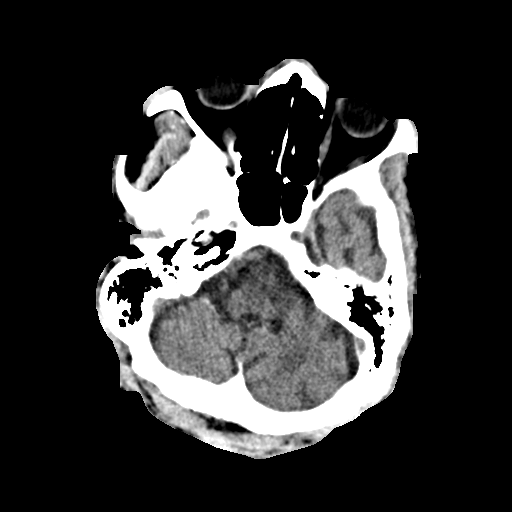
[im 13/36  brain]
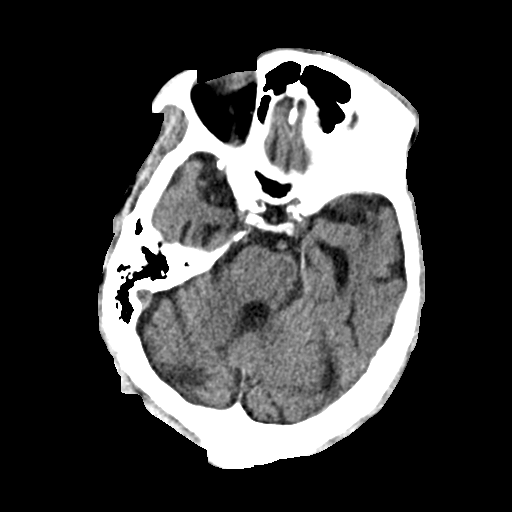
[im 16/36  brain]
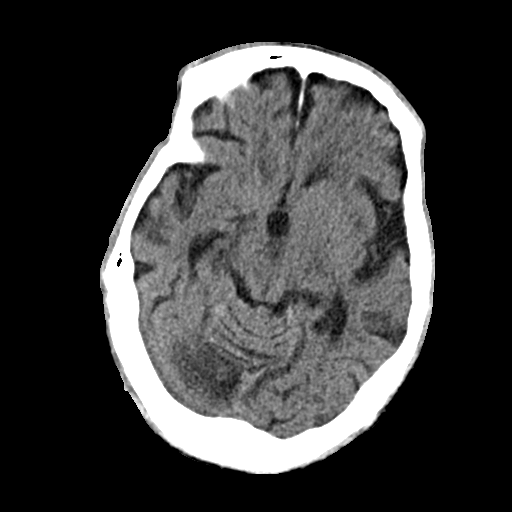
[im 16/36  bone]
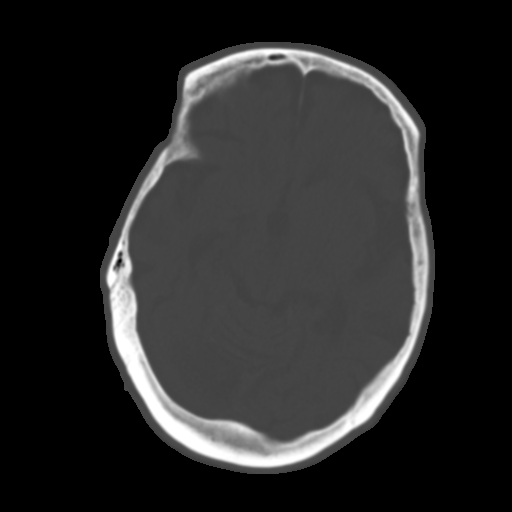
[im 20/36  brain]
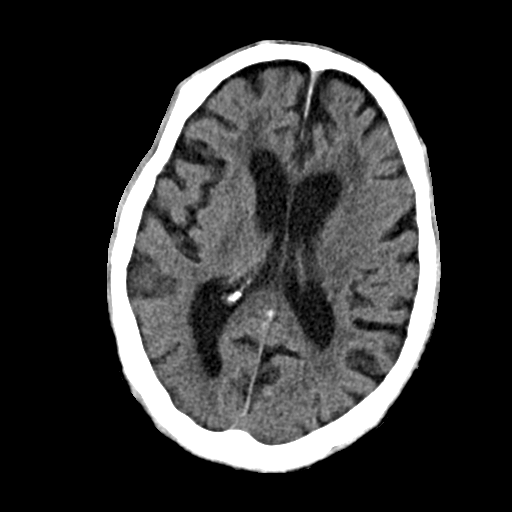
[im 23/36  brain]
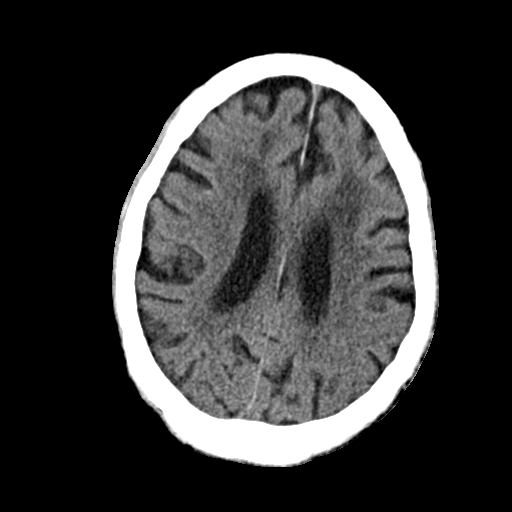
[im 27/36  brain]
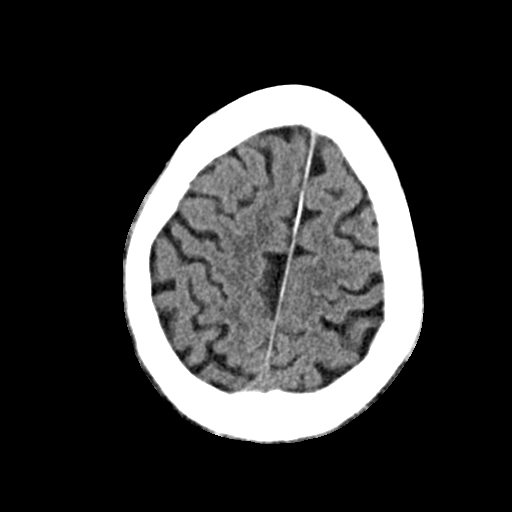
[im 29/36  brain]
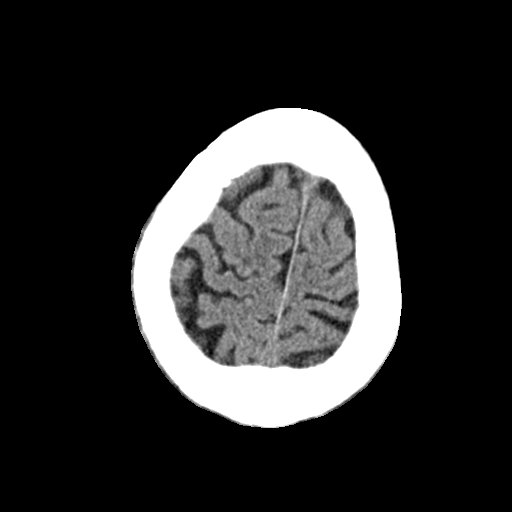
[im 29/36  bone]
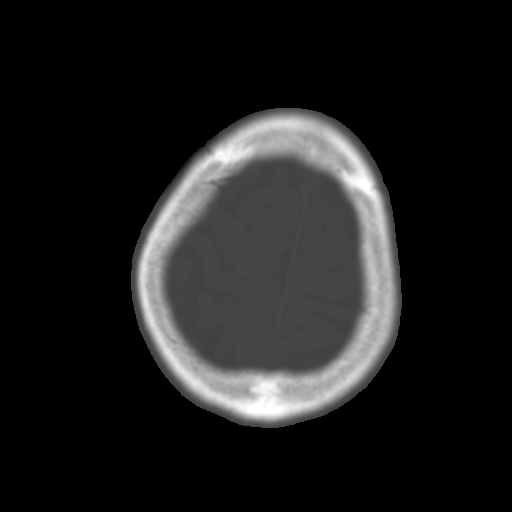
[im 33/36  brain]
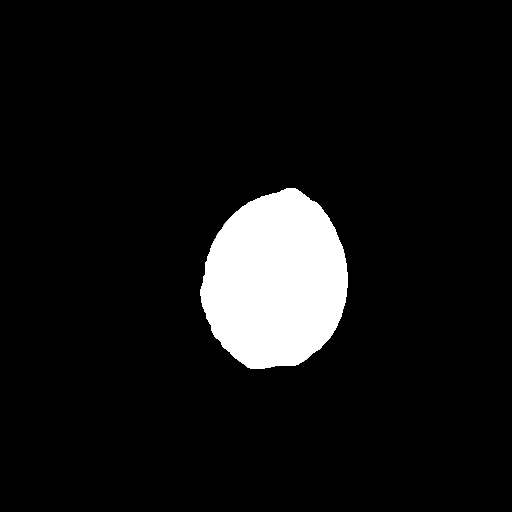

[Series 4: coronal soft · coronal · 0.36mm/px · 3 of 75 slices shown]
[im 25/75  brain]
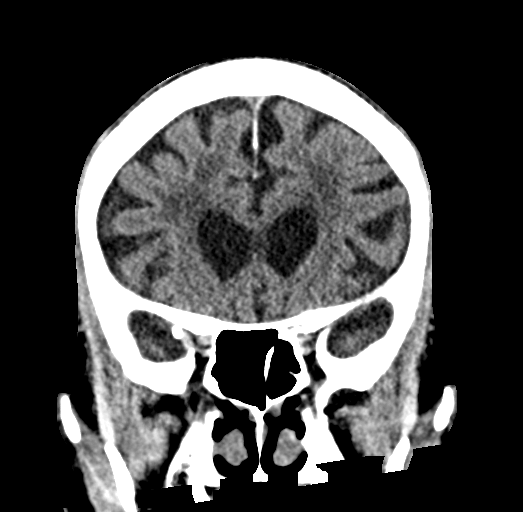
[im 33/75  brain]
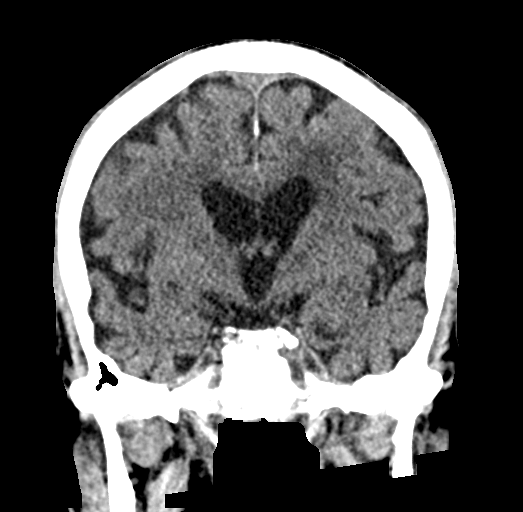
[im 42/75  brain]
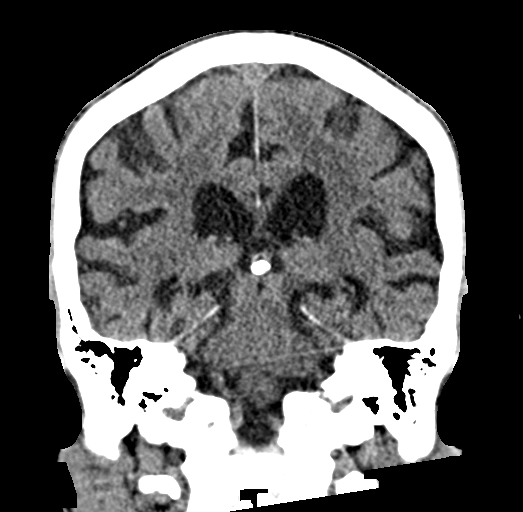

[Series 5: sag soft · sagittal · 0.36mm/px · 3 of 61 slices shown]
[im 23/61  brain]
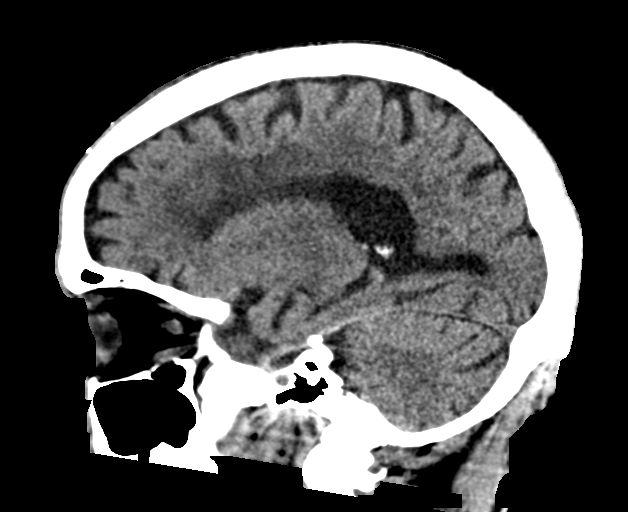
[im 31/61  brain]
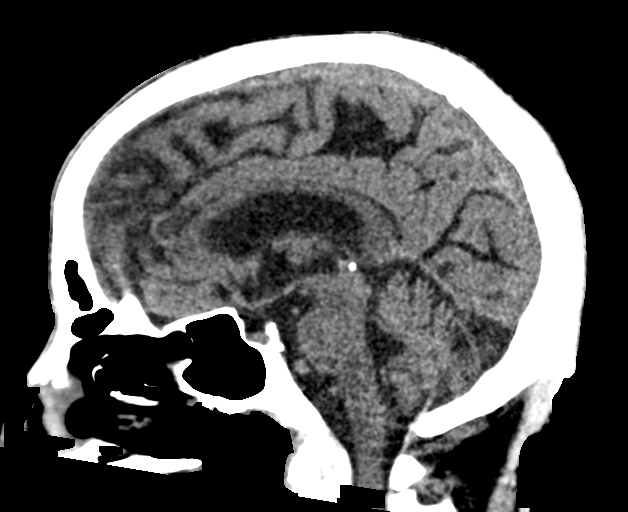
[im 39/61  brain]
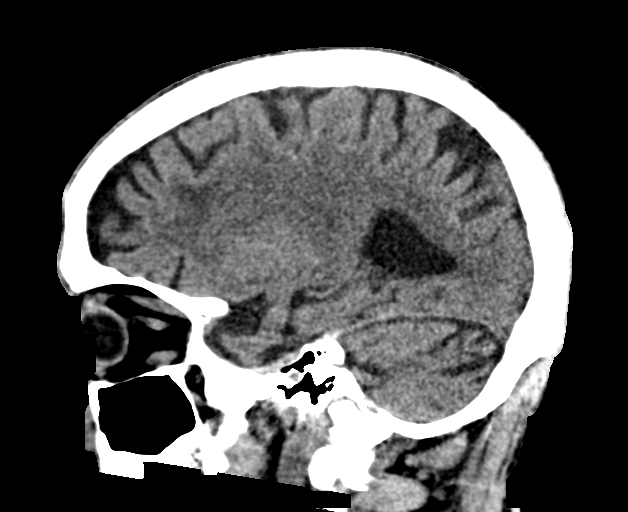

[16 of 47 positions shown; findings below may reference images not displayed]

FINDINGS: Brain: No evidence of acute territorial infarction, hemorrhage,
hydrocephalus,extra-axial collection or mass lesion/mass effect.
There is dilatation the ventricles and sulci consistent with
age-related atrophy. Low-attenuation changes in the deep white
matter consistent with small vessel ischemia.

Vascular: No hyperdense vessel or unexpected calcification.

Skull: The skull is intact. No fracture or focal lesion identified.

Sinuses/Orbits: The visualized paranasal sinuses and mastoid air
cells are clear. The orbits and globes intact.

Other: None
IMPRESSION: No acute intracranial abnormality.

Findings consistent with age related atrophy and chronic small
vessel ischemia

## 2023-02-12 IMAGING — DX DG HAND 2V*L*
2 series · 2 of 2 positions shown · non-contrast
Comparison: None.

CLINICAL DATA: Left hand swelling.  No known injury.  Confusion.

EXAM:
LEFT HAND - 2 VIEW

[hand ap]
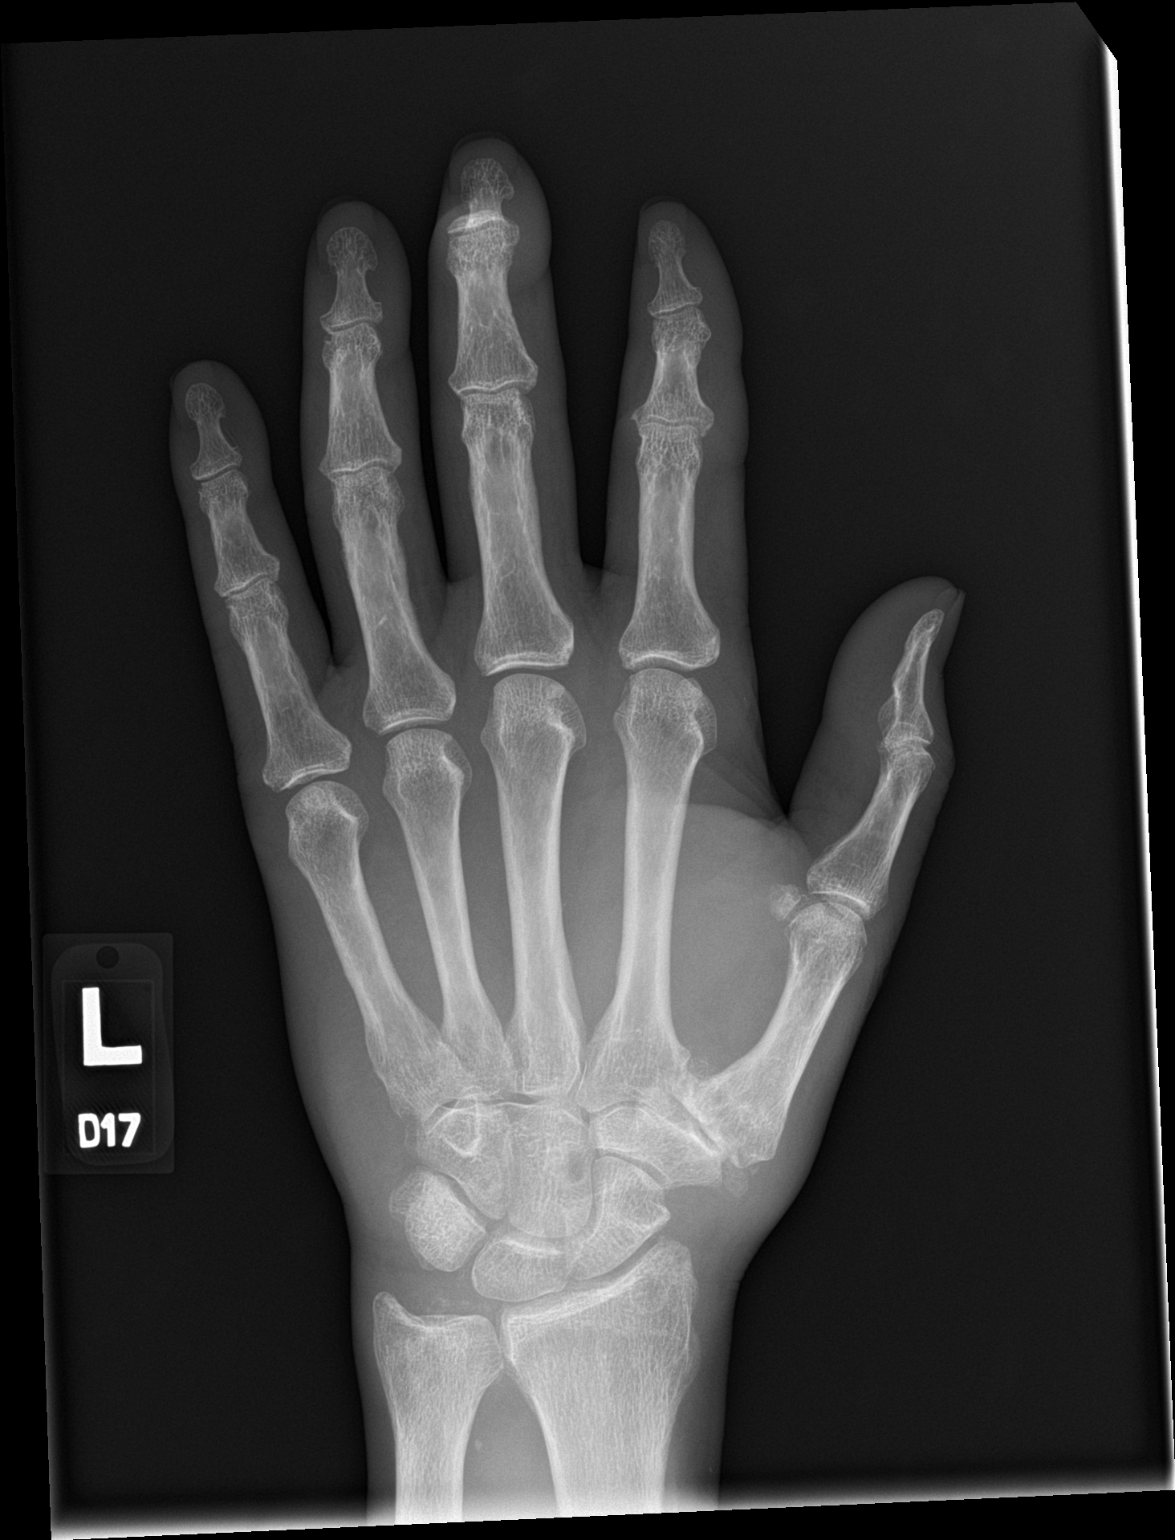

[hand lat]
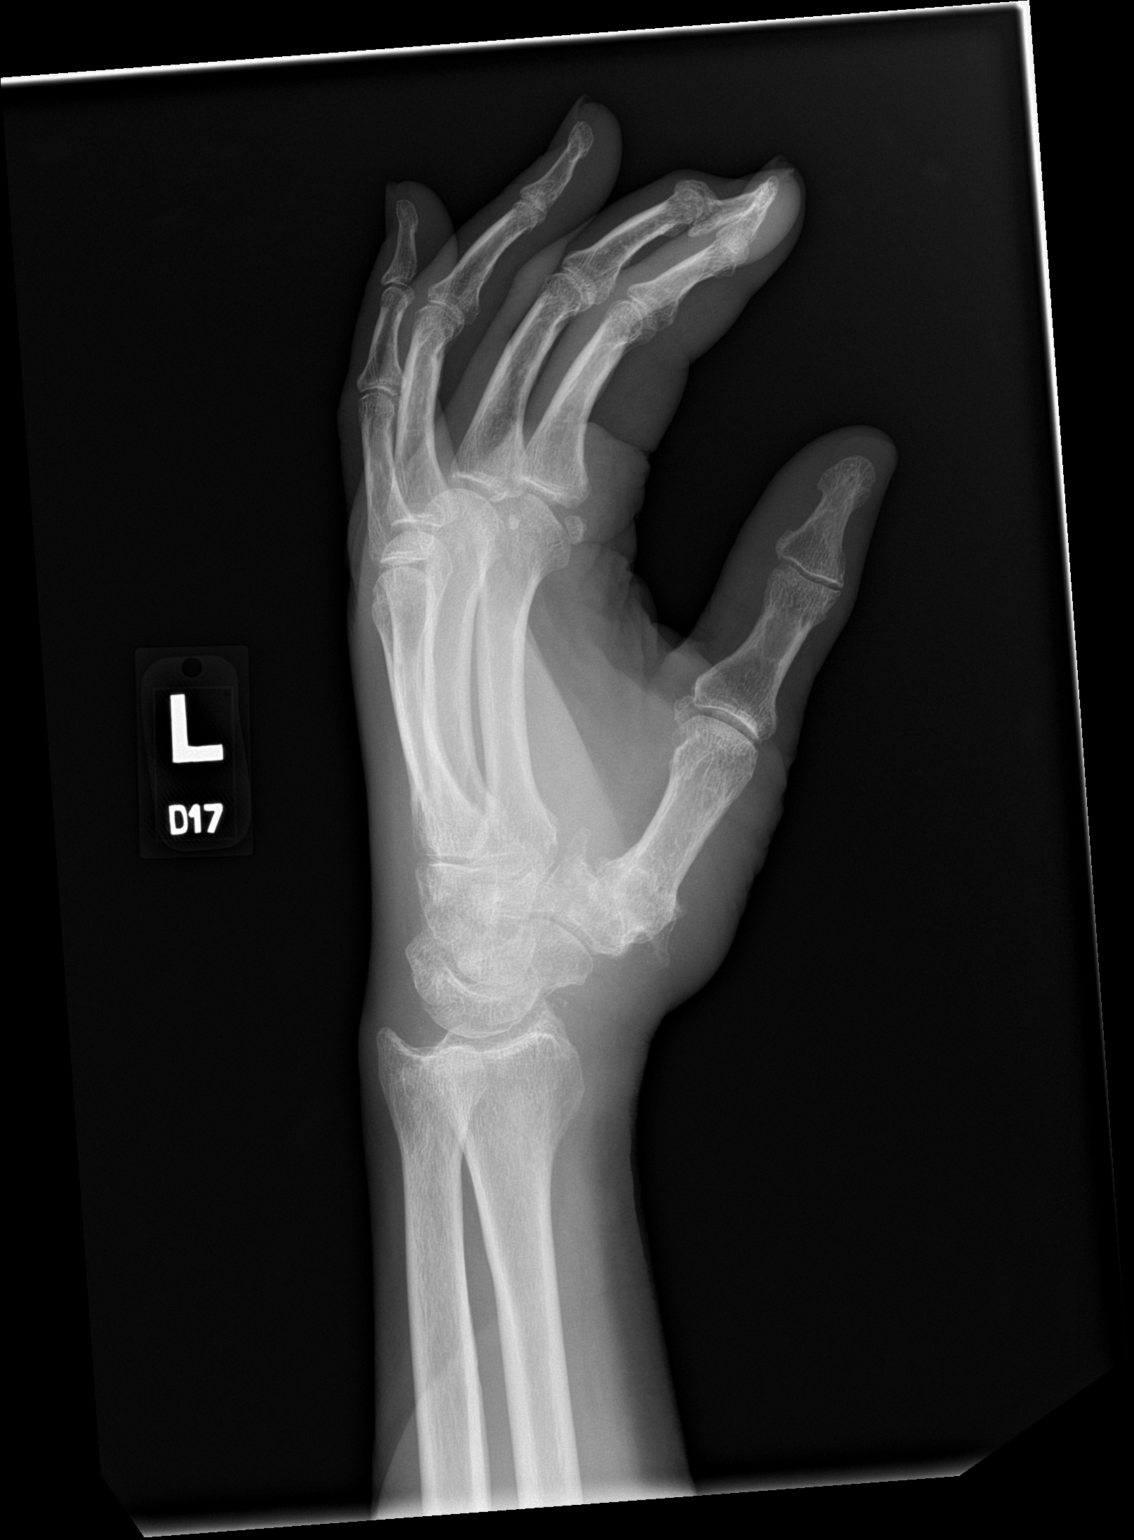

[2 of 2 positions shown; findings below may reference images not displayed]

FINDINGS: PA and oblique lateral views are submitted. The mineralization and
alignment are normal. There is no evidence of acute fracture or
dislocation. There are scattered degenerative changes, most advanced
at the 1st carpometacarpal and 3rd distal interphalangeal joints.
Chondrocalcinosis of the triangular fibrocartilage noted. Possible
mild diffuse soft tissue swelling without soft tissue emphysema or
foreign body.
IMPRESSION: Degenerative changes and possible mild soft tissue swelling. No
acute osseous findings or evidence of foreign body.
